# Patient Record
Sex: Female | Born: 2005 | Race: White | Hispanic: Yes | Marital: Single | Smoking: Never smoker
Health system: Southern US, Community
[De-identification: ages and names within clinical notes are randomized; demographics above are authoritative.]

## PROBLEM LIST (undated history)

## (undated) ENCOUNTER — Emergency Department (HOSPITAL_COMMUNITY): Admission: EM | Payer: Medicaid Other | Source: Home / Self Care

## (undated) DIAGNOSIS — F061 Catatonic disorder due to known physiological condition: Secondary | ICD-10-CM

## (undated) DIAGNOSIS — F419 Anxiety disorder, unspecified: Secondary | ICD-10-CM

## (undated) DIAGNOSIS — F209 Schizophrenia, unspecified: Secondary | ICD-10-CM

## (undated) HISTORY — PX: OOPHORECTOMY: SHX86

---
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
## 2023-04-10 ENCOUNTER — Ambulatory Visit (HOSPITAL_COMMUNITY)
Admission: EM | Admit: 2023-04-10 | Discharge: 2023-04-12 | Disposition: A | Discharge status: Home / Self Care | Payer: Medicaid Other | Attending: Psychiatry | Admitting: Psychiatry

## 2023-04-10 ENCOUNTER — Other Ambulatory Visit: Payer: Self-pay

## 2023-04-10 DIAGNOSIS — R4182 Altered mental status, unspecified: Secondary | ICD-10-CM | POA: Insufficient documentation

## 2023-04-10 DIAGNOSIS — Z1152 Encounter for screening for COVID-19: Secondary | ICD-10-CM | POA: Insufficient documentation

## 2023-04-10 DIAGNOSIS — R462 Strange and inexplicable behavior: Secondary | ICD-10-CM | POA: Insufficient documentation

## 2023-04-10 DIAGNOSIS — F061 Catatonic disorder due to known physiological condition: Secondary | ICD-10-CM

## 2023-04-10 LAB — CBC WITH DIFFERENTIAL/PLATELET
Abs Immature Granulocytes: 0.01 10*3/uL (ref 0.00–0.07)
Basophils Absolute: 0 10*3/uL (ref 0.0–0.1)
Basophils Relative: 0 %
Eosinophils Absolute: 0 10*3/uL (ref 0.0–0.5)
Eosinophils Relative: 1 %
HCT: 37.9 % (ref 36.0–46.0)
Hemoglobin: 12.4 g/dL (ref 12.0–15.0)
Immature Granulocytes: 0 %
Lymphocytes Relative: 27 %
Lymphs Abs: 1.8 10*3/uL (ref 0.7–4.0)
MCH: 28.5 pg (ref 26.0–34.0)
MCHC: 32.7 g/dL (ref 30.0–36.0)
MCV: 87.1 fL (ref 80.0–100.0)
Monocytes Absolute: 0.4 10*3/uL (ref 0.1–1.0)
Monocytes Relative: 6 %
Neutro Abs: 4.3 10*3/uL (ref 1.7–7.7)
Neutrophils Relative %: 66 %
Platelets: 272 10*3/uL (ref 150–400)
RBC: 4.35 MIL/uL (ref 3.87–5.11)
RDW: 12.9 % (ref 11.5–15.5)
WBC: 6.5 10*3/uL (ref 4.0–10.5)
nRBC: 0 % (ref 0.0–0.2)

## 2023-04-10 LAB — COMPREHENSIVE METABOLIC PANEL
ALT: 21 U/L (ref 0–44)
AST: 32 U/L (ref 15–41)
Albumin: 4.1 g/dL (ref 3.5–5.0)
Alkaline Phosphatase: 57 U/L (ref 38–126)
Anion gap: 11 (ref 5–15)
BUN: 14 mg/dL (ref 6–20)
CO2: 26 mmol/L (ref 22–32)
Calcium: 9 mg/dL (ref 8.9–10.3)
Chloride: 102 mmol/L (ref 98–111)
Creatinine, Ser: 1.04 mg/dL — ABNORMAL HIGH (ref 0.44–1.00)
GFR, Estimated: >60 mL/min (ref >60)
Glucose, Bld: 102 mg/dL — ABNORMAL HIGH (ref 70–99)
Potassium: 3.6 mmol/L (ref 3.5–5.1)
Sodium: 139 mmol/L (ref 135–145)
Total Bilirubin: 0.8 mg/dL (ref 0.3–1.2)
Total Protein: 6.9 g/dL (ref 6.5–8.1)

## 2023-04-10 LAB — POCT URINE DRUG SCREEN - MANUAL ENTRY (I-SCREEN)
POC Amphetamine UR: NOT DETECTED
POC Buprenorphine (BUP): NOT DETECTED
POC Cocaine UR: NOT DETECTED
POC Marijuana UR: NOT DETECTED
POC Methadone UR: NOT DETECTED
POC Methamphetamine UR: NOT DETECTED
POC Morphine: NOT DETECTED
POC Oxazepam (BZO): NOT DETECTED
POC Oxycodone UR: NOT DETECTED
POC Secobarbital (BAR): NOT DETECTED

## 2023-04-10 LAB — URINALYSIS, ROUTINE W REFLEX MICROSCOPIC
Bilirubin Urine: NEGATIVE
Glucose, UA: NEGATIVE mg/dL
Hgb urine dipstick: NEGATIVE
Ketones, ur: 5 mg/dL — AB
Leukocytes,Ua: NEGATIVE
Nitrite: NEGATIVE
Protein, ur: NEGATIVE mg/dL
Specific Gravity, Urine: 1.017 (ref 1.005–1.030)
pH: 5 (ref 5.0–8.0)

## 2023-04-10 LAB — POC URINE PREG, ED: Preg Test, Ur: NEGATIVE

## 2023-04-10 LAB — SARS CORONAVIRUS 2 BY RT PCR: SARS Coronavirus 2 by RT PCR: NEGATIVE

## 2023-04-10 LAB — TSH: TSH: 1.082 u[IU]/mL (ref 0.350–4.500)

## 2023-04-10 MED ORDER — ACETAMINOPHEN 325 MG PO TABS: 650.0000 mg | ORAL_TABLET | Freq: Four times a day (QID) | ORAL | Status: DC | PRN

## 2023-04-10 MED ORDER — HYDROXYZINE HCL 25 MG PO TABS: 25.0000 mg | ORAL_TABLET | Freq: Four times a day (QID) | ORAL | Status: DC | PRN

## 2023-04-10 MED ORDER — MAGNESIUM HYDROXIDE 400 MG/5ML PO SUSP: 30.0000 mL | Freq: Every day | ORAL | Status: DC | PRN

## 2023-04-10 MED ORDER — OLANZAPINE 5 MG PO TBDP
5.0000 mg | ORAL_TABLET | Freq: Every day | ORAL | Status: DC
  Filled 2023-04-10 (×2): qty 1

## 2023-04-10 MED ORDER — ALUM & MAG HYDROXIDE-SIMETH 200-200-20 MG/5ML PO SUSP: 30.0000 mL | ORAL | Status: DC | PRN

## 2023-04-10 MED ORDER — LORAZEPAM 1 MG PO TABS
2.0000 mg | ORAL_TABLET | Freq: Once | ORAL | Status: AC
  Administered 2023-04-10: 2 mg via ORAL
  Filled 2023-04-10: qty 2

## 2023-04-10 MED ORDER — DIPHENHYDRAMINE HCL 25 MG PO CAPS: 25.0000 mg | ORAL_CAPSULE | Freq: Four times a day (QID) | ORAL | Status: DC | PRN

## 2023-04-10 MED ORDER — LORAZEPAM 1 MG PO TABS: 1.0000 mg | ORAL_TABLET | Freq: Four times a day (QID) | ORAL | Status: DC | PRN

## 2023-04-10 NOTE — ED Notes (Signed)
Patient has been admitted to adult obs bed 1 r/t blizzar behaviors per family. Patient is A&O x 3. Observed stirring into space or at the walls as if responding to an internal stimulus.  Patient answers some question appropriately and declined to answer others. Patient speech is soft and pressured when answering questions. She denies SI, HI but would not answer concerning AVH. Cup of water given as mother reports patient has just gotten over having a kidney stone.

## 2023-04-10 NOTE — BH Assessment (Signed)
Comprehensive Clinical Assessment (CCA) Note  04/10/2023 EBONE DELAGUILA [id number] DISPOSITION: Weber NP recommends patient be observed in continuous assessment.   The patient demonstrates the following risk factors for suicide: Chronic risk factors for suicide include: N/A. Acute risk factors for suicide include: N/A. Protective factors for this patient include: coping skills. Considering these factors, the overall suicide risk at this point appears to be low. Patient is appropriate for outpatient follow up.   Patient is a 17 year old female that presents this date as a voluntary walk in brought in by her parents after she has not been responsive for the last two days. Patient seems to process some of the content of this writer's questions and nods her head, "yes and no," to a few questions. Patient denies any S/I, H/I or AVH.  Patient is observed to be sitting in the triage room and just stares at this writer and NP Weber as we attempted to assess. Patient denies any ingestions or SA history. Patient when asked to sign her voluntary consent only makes one letter. "H." This Clinical research associate and NP Weber spent a considerable amount of time trying to gather her history unsuccessfully.   History is limited per chart review and is obtained from family who is present. Mother states patient resides with her and her brother at their residence and briefly attended [employer] for one semester before withdrawing. Mother reports patient has always been introverted and quite but denies that patient to her knowledge, has ever attempted to self harm or had a SA history. Mother states patient has never been diagnosed with a mental health disorder or has ever been admitted into a mental health facility.   Mother states patient had lost her father two years ago and had brought a picture of him into her room a few days ago once she started withdrawing. Patient currently lives with Mom and brother. Patient's father died  two years ago. She experienced some bullying her senior year of high school. Per Mom, patient has withdrawn more and more over the last few months, she has stopped going to classes at [employer] and for the last two days has been essentially mute and sleeping a lot. She has also done some bizarre things like moved her father's picture to the middle of the kitchen and spent hours in what appears to be prayer.   She is alert, oriented X 2 flat, and mostly mute. Her mood is depressed with congruent affect.  She has very limited and blocked speech; behavior is bizarre. Objectively there is evidence of psychosis or delusional thinking; patient stares off into corners or at walls.  Patient is not able to converse coherently. She is very pre-occupied.  She denies suicidal and homicidal ideation. She is unable to respond when asked about psychosis or paranoia.  Patient was unable or unwilling to answer most questions appropriately.       Chief Complaint: No chief complaint on file.  Visit Diagnosis: Altered Mental State     CCA Screening, Triage and Referral (STR)  Patient Reported Information How did you hear about [place]? Self  What Is the Reason for Your Visit/Call Today? Patient is a 17 year old female that presents this date voluntary brought in by family members with an altered mental state. Patient denies any S/I, H/I or AVH although renders limited history. Patient appears not to be able to process all the content of this writer's questions and just will nod her head "yes and no," to questions she  seems to understand. History is limited per chart review. Patient denies any SA history  How Long Has This Been Causing You Problems? <Week  What Do You Feel Would Help You the Most Today? Treatment for Depression or other mood problem   Have You Recently Had Any Thoughts About Hurting Yourself? No  Are You Planning to Commit Suicide/Harm Yourself At This time? No  Flowsheet Row ED from 04/10/2023 in  [facility]  C-SSRS RISK CATEGORY No Risk          Have you Recently Had Thoughts About Hurting Someone Karolee Ohs? No  Are You Planning to Harm Someone at This Time? No  Explanation: NA   Have You Used Any Alcohol or Drugs in the Past 24 Hours? No  What Did You Use and How Much? NA   Do You Currently Have a Therapist/Psychiatrist? No  Name of Therapist/Psychiatrist: Name of Therapist/Psychiatrist: NA   Have You Been Recently Discharged From Any Office Practice or Programs? No  Explanation of Discharge From Practice/Program: NA     CCA Screening Triage Referral Assessment Type of Contact: Face-to-Face  Telemedicine Service Delivery:   Is this Initial or Reassessment?   Date Telepsych consult ordered in [facility]:    Time Telepsych consult ordered in [facility]:    Location of Assessment: [facility] Assessment Services  Provider Location: [facility] Assessment Services   Collateral Involvement: Parents who are present   Does Patient Have a Automotive engineer Guardian? No  Legal Guardian Contact Information: NA  Copy of Legal Guardianship Form: -- (NA)  Legal Guardian Notified of Arrival: -- (NA)  Legal Guardian Notified of Pending Discharge: -- (NA)  If Minor and Not Living with Parent(s), Who has Custody? NA  Is CPS involved or ever been involved? Never  Is APS involved or ever been involved? Never   Patient Determined To Be At Risk for Harm To Self or Others Based on Review of Patient Reported Information or Presenting Complaint? No  Method: No Plan  Availability of Means: No access or NA  Intent: Vague intent or NA  Notification Required: No need or identified person  Additional Information for Danger to Others Potential: -- (NA)  Additional Comments for Danger to Others Potential: NA  Are There Guns or Other Weapons in Your Home? No  Types of Guns/Weapons: NA  Are These Weapons Safely Secured?                            --  (NA)  Who Could Verify You Are Able To Have These Secured: NA  Do You Have any Outstanding Charges, Pending Court Dates, Parole/Probation? Denies  Contacted To Inform of Risk of Harm To Self or Others: Other: Comment (NA)    Does Patient Present under Involuntary Commitment? No    [place] of Residence: [place]   Patient Currently Receiving the Following Services: Not Receiving Services   Determination of Need: Urgent (48 hours)   Options For Referral: Other: Comment (Continuious assessment)     CCA Biopsychosocial Patient Reported Schizophrenia/Schizoaffective Diagnosis in Past: No   Strengths: UTA due to patient's AMS   Mental Health Symptoms Depression:   -- (UTA)   Duration of Depressive symptoms:    Mania:   None   Anxiety:    -- (UTA)   Psychosis:   -- (UTA)   Duration of Psychotic symptoms:    Trauma:   -- (UTA)   Obsessions:  None   Compulsions:   None   Inattention:   None   Hyperactivity/Impulsivity:   None   Oppositional/Defiant Behaviors:   None   Emotional Irregularity:   -- (UTA)   Other Mood/Personality Symptoms:   UTA    Mental Status Exam Appearance and self-care  Stature:   Small   Weight:   Thin   Clothing:   Casual   Grooming:   Normal   Cosmetic use:   None   Posture/gait:   Normal   Motor activity:   Not Remarkable   Sensorium  Attention:   Distractible   Concentration:   Preoccupied; Scattered   Orientation:   -- (UTA)   Recall/memory:   -- (UTA)   Affect and Mood  Affect:   Flat; Depressed   Mood:   Depressed   Relating  Eye contact:   Fleeting   Facial expression:   Depressed   Attitude toward examiner:   Guarded   Thought and Language  Speech flow:  Paucity; Slow   Thought content:   Suspicious   Preoccupation:   None   Hallucinations:   None   Organization:   Disorganized   Company secretary of Knowledge:   Poor   Intelligence:    Average   Abstraction:   Normal   Judgement:   Impaired   Reality Testing:   Distorted   Insight:   Poor   Decision Making:   Vacilates   Social Functioning  Social Maturity:   Responsible   Social Judgement:   Normal   Stress  Stressors:   -- Industrial/product designer)   Coping Ability:   -- Industrial/product designer)   Skill Deficits:   -- (UTA)   Supports:   Family     Religion: Religion/Spirituality Are You A Religious Person?:  (UTA) How Might This Affect Treatment?: UTA  Leisure/Recreation: Leisure / Recreation Do You Have Hobbies?:  (UTA)  Exercise/Diet: Exercise/Diet Do You Exercise?:  (UTA) Have You Gained or Lost A Significant Amount of Weight in the Past Six Months?:  (UTA) Do You Follow a Special Diet?:  (UTA) Do You Have Any Trouble Sleeping?:  (UTA)   CCA Employment/Education Employment/Work Situation: Employment / Work Situation Employment Situation: Unemployed Patient's Job has Been Impacted by Current Illness: No Has Patient ever Been in Equities trader?: No  Education: Education Is Patient Currently Attending School?: No Last Grade Completed: 12 Did You Product manager?: Yes What Type of College Degree Do you Have?: Parents report patient was attending [employer] but has withdrawn Did You Have An Individualized Education Program (IIEP): No Did You Have Any Difficulty At School?: No Patient's Education Has Been Impacted by Current Illness: No   CCA Family/Childhood History Family and Relationship History: Family history Marital status: Single Does patient have children?: No  Childhood History:  Childhood History By whom was/is the patient raised?: Both parents Did patient suffer any verbal/emotional/physical/sexual abuse as a child?: No Did patient suffer from severe childhood neglect?: No Has patient ever been sexually abused/assaulted/raped as an adolescent or adult?: No Was the patient ever a victim of a crime or a disaster?: No Witnessed domestic violence?:  No Has patient been affected by domestic violence as an adult?: No       CCA Substance Use Alcohol/Drug Use: Alcohol / Drug Use Pain Medications: See MAR Prescriptions: See MAR Over the Counter: See MAR History of alcohol / drug use?: No history of alcohol / drug abuse Longest period of sobriety (when/how long): NA  Negative Consequences of Use:  (NA) Withdrawal Symptoms:  (NA)                         ASAM's:  Six Dimensions of Multidimensional Assessment  Dimension 1:  Acute Intoxication and/or Withdrawal Potential:   Dimension 1:  Description of individual's past and current experiences of substance use and withdrawal: NA  Dimension 2:  Biomedical Conditions and Complications:   Dimension 2:  Description of patient's biomedical conditions and  complications: NA  Dimension 3:  Emotional, Behavioral, or Cognitive Conditions and Complications:  Dimension 3:  Description of emotional, behavioral, or cognitive conditions and complications: NA  Dimension 4:  Readiness to Change:  Dimension 4:  Description of Readiness to Change criteria: NA  Dimension 5:  Relapse, Continued use, or Continued Problem Potential:  Dimension 5:  Relapse, continued use, or continued problem potential critiera description: NA  Dimension 6:  Recovery/Living Environment:  Dimension 6:  Recovery/Iiving environment criteria description: NA  ASAM Severity Score:    ASAM Recommended Level of Treatment: ASAM Recommended Level of Treatment:  (NA)   Substance use Disorder (SUD) Substance Use Disorder (SUD)  Checklist Symptoms of Substance Use:  (NA)  Recommendations for Services/Supports/Treatments: Recommendations for Services/Supports/Treatments Recommendations For Services/Supports/Treatments:  (NA)  Discharge Disposition:    DSM5 Diagnoses: There are no problems to display for this patient.    Referrals to Alternative Service(s): Referred to Alternative Service(s):   Place:   Date:   Time:     Referred to Alternative Service(s):   Place:   Date:   Time:    Referred to Alternative Service(s):   Place:   Date:   Time:    Referred to Alternative Service(s):   Place:   Date:   Time:     Alfredia Ferguson, LCAS

## 2023-04-10 NOTE — ED Notes (Addendum)
Dash made aware of stat lab p/u from facility to go to [facility] lab stat, Order # [id number].

## 2023-04-10 NOTE — ED Notes (Signed)
Pt A&O x 4, sitting up at bedside, no distress noted.  Gait slightly unsteady.  Monitoring for safety.

## 2023-04-10 NOTE — ED Provider Notes (Signed)
Behavioral Health Urgent Care Medical Screening Exam  Patient Name: Terri Rich MRN: [id number] Date of Evaluation: 04/10/23 Chief Complaint:  Mom says "my daughter is not ok" Diagnosis:  Final diagnoses:  Altered mental status, unspecified altered mental status type    History of Present illness: Terri Rich is a 17 y.o. female brought to [facility] by her family after bizarre behavior and not speaking for two days.    Terri Rich, 17 y.o., female patient seen face to face by this provider, consulted with Dr. Viviano Simas; and chart reviewed on 04/10/23.  On evaluation Terri Rich is mostly nonverbal.  She provides a few yes or no answers to questions and appears to be struggling to speak.  Patient arrives in a disheveled state.  She spends much of the interview staring at the wall and only occasionally making eye contact. When signing the voluntary form, patient was only able to write an H.  Patient appears to be responding to internal stimuli throughout interview.    During evaluation Terri Rich is seated in a chair in no acute distress. She is alert, oriented X 2 flat, and mostly mute.  Her mood is depressed with congruent affect.  She has very limited and blocked speech; behavior is bizarre.  Objectively there is evidence of psychosis or delusional thinking; patient stares off into corners or at walls.  Patient is not able to converse coherently. She is very pre-occupied.  She denies suicidal and homicidal ideation. She is unable to respond when asked about psychosis or paranoia.  Patient was unable or unwilling to answer most questions appropriately.    Collateral from Mom:  Patient currently lives with Mom and brother.  She is a Consulting civil engineer at [employer]. 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 She experienced some bullying her senior year of high school and missed enough class graduation was at risk. Per Mom, patient has withdrawn more  and more over the last few months, she has stopped going to classes at [employer] and for the last two days has been essentially mute and sleeping a lot.  She has also done some bizarre things like moved her father's picture to the middle of the kitchen and spent hours in what appears to be prayer. Patient has not spoken for two days and she is sleeping 9 hours or more at night.  Mom has never seen her like this before.  Initially Mom said there is no family history of mental illness.  She then corrected herself and said the patient's aunt had a similar episode, took some medicine and was fine.              Psychiatric Specialty Exam  Presentation  General Appearance:Disheveled  Eye Contact:Fleeting  Speech:Blocked  Speech Volume:Decreased  Handedness:Right   Mood and Affect  Mood: Depressed  Affect: Congruent   Thought Process  Thought Processes: Other (comment) (Unable to assess)  Descriptions of Associations:-- (Unable to assess)  Orientation:Other (comment) (Unable to assess)  Thought Content:Other (comment) (Unable to assess)    Hallucinations:Other (comment) (Unable to assess)  Ideas of Reference:Other (comment) (Unable to assess)  Suicidal Thoughts:No  Homicidal Thoughts:No   Sensorium  Memory: Other (comment) (Unable to assess)  Judgment: Other (comment) (Unable to assess)  Insight: Other (comment) (Unable to assess)   Executive Functions  Concentration: Other (comment) (Unable to assess)  Attention Span: Other (comment) (Unable to assess)  Recall: Other (comment) (Unable to assess)  Fund of Knowledge: Other (comment) (Unable to assess)  Language: Other (comment) (Unable to assess)   Psychomotor Activity  Psychomotor Activity: Decreased   Assets  Assets: Housing; Social Support; Physical Health   Sleep  Sleep: Good  Number of hours:  10   Physical Exam: Physical Exam Vitals and nursing note reviewed. Exam conducted with a  chaperone present.  Eyes:     Pupils: Pupils are equal, round, and reactive to light.  Pulmonary:     Effort: Pulmonary effort is normal.  Skin:    General: Skin is dry.  Neurological:     Mental Status: She is alert. She is disoriented.  Psychiatric:        Speech: She is noncommunicative.        Behavior: Behavior is withdrawn.    Review of Systems  Psychiatric/Behavioral:         Altered mental status  All other systems reviewed and are negative.  Blood pressure 114/77, pulse 77, temperature 97.8 F (36.6 C), temperature source Oral, resp. rate 16, SpO2 100%. There is no height or weight on file to calculate BMI.  Musculoskeletal: Strength & Muscle Tone: within normal limits Gait & Station: normal Patient leans: N/A   [facility] MSE Discharge Disposition for Follow up and Recommendations: Observe overnight and reassess in the AM   Thomes Lolling, NP 04/10/2023, 12:25 PM

## 2023-04-10 NOTE — ED Notes (Signed)
Patient A&O x 3. More verbally communicative with improved eye contact. Consumed a meal and juice for dinner. No further indication of responsiveness to internal stimuli. Denies SI, HI.

## 2023-04-11 NOTE — ED Notes (Signed)
Pt sleeping at present, no distress noted.  Monitoring for safety. 

## 2023-04-11 NOTE — ED Notes (Addendum)
Patient lying in bed, in no acute distress, this nurse went to speak with patient, patient is slow to respond.

## 2023-04-11 NOTE — ED Notes (Addendum)
Patient A&O x 2-3 with a blank and distant facial expression. She appears to be responding to internal stimuli by intermittently darting eyes upward and stirring for a few seconds.Patient has increase in verbal communication that remains pressured and soft. She is able to answer simple questions with few word phrases. Patient denies SI, HI and visual hallucinations. Patient is currently eating cereal for breakfast. Patient agreed verbally to a safety contract.

## 2023-04-11 NOTE — ED Notes (Signed)
This patients mother called and inquired on the patients condition. This nurse advised the mother that I am not able to give her any information on her daughter with out the patients consent. The mother became argumentative, and said that her daughter called her and said she wants to be picked up. This nurse advised the mother that if the patient gives this nurse consent I am happy to speak with her about her daughter. The mother said she spoke with another nurse a day ago who provided information to her. This nurse advised the mother that due to HIPAA guidelines, I will have to obtain consent from her daughter to provide information to her. This nurse advised the patients mother that at this time I have to provide patient care and can not continue to stay on the phone and go back and forth with her, however I will go and speak with her daughter and if the gives consent I will call her back, if not I will instruct the daughter to call her.

## 2023-04-11 NOTE — ED Notes (Signed)
Pt sitting up eating at present, no distress noted.  Monitoring for safety.

## 2023-04-11 NOTE — ED Provider Notes (Signed)
Behavioral Health Progress Note  Date and Time: 04/11/2023 1:50 PM Name: Terri Rich MRN:  [id number]  Subjective:  Terri Rich, 17 y.o., female patient seen face to face by this provider, consulted with Dr. Viviano Simas; and chart reviewed on 04/11/23.  On evaluation Terri Rich reports she slept well.  Patient is slightly more interactive than her initial presentation yesterday.  Psychomotor activity remains slow.  Patient continues to stare at the wall intermittently appearing to respond to internal stimuli at times.  Patient answers more questions today than yesterday although thought blocking remains present.    Patient was provided 2mg  of ativan yesterday afternoon as an ativan challenge. Symptoms did not improve significantly with the ativan.  Patient received 5mg  of zyprexa 10/19 at [facility] and symptoms have not significantly improved.  Plan to continue the zyprexa and seek inpatient psychiatric admission.   During evaluation Terri Rich is laying in bed in no acute distress. She is alert, oriented x 2 calm, and cooperative.  Her mood is depressed  with congruent affect.  She has slow speech with thought blocking, and slow psychomotor.  Objectively there is evidence of psychosis/mania or delusional thinking.  Patient is not able to converse coherently, there are no clear goal directed thoughts and she seems pre-occupied often.  She also denies suicidal/self-harm/homicidal ideation, psychosis, and paranoia.  Patient answered more questions today than yesterday.   Patient does not appear to be able to care for herself in her current state. She appears to be responding to internal stimuli and requires inpatient psychiatric hospitalization for stabilization and management.   Diagnosis:  Final diagnoses:  Altered mental status, unspecified altered mental status type    Total Time spent with patient: 30 minutes  Past Psychiatric History: None  noted Past Medical History: Kidney stones Family History: None noted Family Psychiatric  History: Aunt - unknown mental health "episode" Social History: Patient lives with her mother and brother; father died in Apr 27, 2021  Additional Social History:    Pain Medications: See MAR Prescriptions: See MAR Over the Counter: See MAR History of alcohol / drug use?: No history of alcohol / drug abuse Longest period of sobriety (when/how long): NA Negative Consequences of Use:  (NA) Withdrawal Symptoms:  (NA)                    Sleep: Good  Appetite:  Good  Current Medications:  Current Facility-Administered Medications  Medication Dose Route Frequency Provider Last Rate Last Admin   acetaminophen (TYLENOL) tablet 650 mg  650 mg Oral Q6H PRN Marcellas Marchant A, NP       alum & mag hydroxide-simeth (MAALOX/MYLANTA) 200-200-20 MG/5ML suspension 30 mL  30 mL Oral Q4H PRN Kawena Lyday A, NP       diphenhydrAMINE (BENADRYL) capsule 25 mg  25 mg Oral Q6H PRN Catarina Huntley A, NP       hydrOXYzine (ATARAX) tablet 25 mg  25 mg Oral Q6H PRN Shantoya Geurts A, NP       LORazepam (ATIVAN) tablet 1 mg  1 mg Oral Q6H PRN Shalev Helminiak A, NP       magnesium hydroxide (MILK OF MAGNESIA) suspension 30 mL  30 mL Oral Daily PRN Arliss Hepburn A, NP       OLANZapine zydis (ZYPREXA) disintegrating tablet 5 mg  5 mg Oral QHS Jabar Krysiak A, NP       Current Outpatient Medications  Medication Sig Dispense Refill   magnesium oxide (MAG-OX)  400 (240 Mg) MG tablet Take 400 mg by mouth daily.     cetirizine (ZYRTEC) 10 MG tablet Take 1 tablet (10 mg total) by mouth at bedtime. 30 tablet 0   dicyclomine (BENTYL) 10 MG capsule Take 1 capsule (10 mg total) by mouth 3 (three) times daily as needed for spasms. 10 capsule 0   ibuprofen (ADVIL,MOTRIN) 100 MG/5ML suspension Take 13.6 mLs (272 mg total) by mouth every 6 (six) hours as needed for mild pain or moderate pain. 237 mL 0   ondansetron (ZOFRAN-ODT) 4 MG disintegrating tablet Take  1 tablet (4 mg total) by mouth every 8 (eight) hours as needed for nausea or vomiting. 10 tablet 0   pseudoephedrine (SUDAFED) 30 MG tablet Take 1 tablet (30 mg total) by mouth every 6 (six) hours as needed for congestion. 30 tablet 0    Labs  Lab Results:  Admission on 04/10/2023  Component Date Value Ref Range Status   SARS Coronavirus 2 by RT PCR 04/10/2023 NEGATIVE  NEGATIVE Final   Performed at [facility], [address], [place], [place] [address]   WBC 04/10/2023 6.5  4.0 - 10.5 K/uL Final   RBC 04/10/2023 4.35  3.87 - 5.11 MIL/uL Final   Hemoglobin 04/10/2023 12.4  12.0 - 15.0 g/dL Final   HCT 60/45/4098 37.9  36.0 - 46.0 % Final   MCV 04/10/2023 87.1  80.0 - 100.0 fL Final   MCH 04/10/2023 28.5  26.0 - 34.0 pg Final   MCHC 04/10/2023 32.7  30.0 - 36.0 g/dL Final   RDW 11/91/4782 12.9  11.5 - 15.5 % Final   Platelets 04/10/2023 272  150 - 400 K/uL Final   nRBC 04/10/2023 0.0  0.0 - 0.2 % Final   Neutrophils Relative % 04/10/2023 66  % Final   Neutro Abs 04/10/2023 4.3  1.7 - 7.7 K/uL Final   Lymphocytes Relative 04/10/2023 27  % Final   Lymphs Abs 04/10/2023 1.8  0.7 - 4.0 K/uL Final   Monocytes Relative 04/10/2023 6  % Final   Monocytes Absolute 04/10/2023 0.4  0.1 - 1.0 K/uL Final   Eosinophils Relative 04/10/2023 1  % Final   Eosinophils Absolute 04/10/2023 0.0  0.0 - 0.5 K/uL Final   Basophils Relative 04/10/2023 0  % Final   Basophils Absolute 04/10/2023 0.0  0.0 - 0.1 K/uL Final   Immature Granulocytes 04/10/2023 0  % Final   Abs Immature Granulocytes 04/10/2023 0.01  0.00 - 0.07 K/uL Final   Performed at [facility], [address] [place], [place] [address]   Sodium 04/10/2023 139  135 - 145 mmol/L Final   Potassium 04/10/2023 3.6  3.5 - 5.1 mmol/L Final   Chloride 04/10/2023 102  98 - 111 mmol/L Final   CO2 04/10/2023 26  22 - 32 mmol/L Final   Glucose, Bld 04/10/2023 102 (H)  70 - 99 mg/dL Final   Glucose reference range applies only to samples  taken after fasting for at least 8 hours.   BUN 04/10/2023 14  6 - 20 mg/dL Final   Creatinine, Ser 04/10/2023 1.04 (H)  0.44 - 1.00 mg/dL Final   Calcium 30/86/5784 9.0  8.9 - 10.3 mg/dL Final   Total Protein 69/62/9528 6.9  6.5 - 8.1 g/dL Final   Albumin 41/32/4401 4.1  3.5 - 5.0 g/dL Final   AST 02/72/5366 32  15 - 41 U/L Final   ALT 04/10/2023 21  0 - 44 U/L Final  Alkaline Phosphatase 04/10/2023 57  38 - 126 U/L Final   Total Bilirubin 04/10/2023 0.8  0.3 - 1.2 mg/dL Final   GFR, Estimated 04/10/2023 >60  >60 mL/min Final   Comment: (NOTE) Calculated using the CKD-EPI Creatinine Equation (2021)    Anion gap 04/10/2023 11  5 - 15 Final   Performed at [facility], [address] [place], [place] [address]   TSH 04/10/2023 1.082  0.350 - 4.500 uIU/mL Final   Comment: Performed by a 3rd Generation assay with a functional sensitivity of <=0.01 uIU/mL. Performed at [facility], [address] [place], [place] [address]    Color, Urine 04/10/2023 YELLOW  YELLOW Final   APPearance 04/10/2023 CLEAR  CLEAR Final   Specific Gravity, Urine 04/10/2023 1.017  1.005 - 1.030 Final   pH 04/10/2023 5.0  5.0 - 8.0 Final   Glucose, UA 04/10/2023 NEGATIVE  NEGATIVE mg/dL Final   Hgb urine dipstick 04/10/2023 NEGATIVE  NEGATIVE Final   Bilirubin Urine 04/10/2023 NEGATIVE  NEGATIVE Final   Ketones, ur 04/10/2023 5 (A)  NEGATIVE mg/dL Final   Protein, ur 29/52/8413 NEGATIVE  NEGATIVE mg/dL Final   Nitrite 24/40/1027 NEGATIVE  NEGATIVE Final   Leukocytes,Ua 04/10/2023 NEGATIVE  NEGATIVE Final   Performed at [facility], [address] [place], [place] [address]   Preg Test, Ur 04/10/2023 Negative  Negative Final   POC Amphetamine UR 04/10/2023 None Detected  NONE DETECTED (Cut Off Level 1000 ng/mL) Final   POC Secobarbital (BAR) 04/10/2023 None Detected  NONE DETECTED (Cut Off Level 300 ng/mL) Final   POC Buprenorphine (BUP) 04/10/2023 None Detected  NONE DETECTED (Cut Off  Level 10 ng/mL) Final   POC Oxazepam (BZO) 04/10/2023 None Detected  NONE DETECTED (Cut Off Level 300 ng/mL) Final   POC Cocaine UR 04/10/2023 None Detected  NONE DETECTED (Cut Off Level 300 ng/mL) Final   POC Methamphetamine UR 04/10/2023 None Detected  NONE DETECTED (Cut Off Level 1000 ng/mL) Final   POC Morphine 04/10/2023 None Detected  NONE DETECTED (Cut Off Level 300 ng/mL) Final   POC Methadone UR 04/10/2023 None Detected  NONE DETECTED (Cut Off Level 300 ng/mL) Final   POC Oxycodone UR 04/10/2023 None Detected  NONE DETECTED (Cut Off Level 100 ng/mL) Final   POC Marijuana UR 04/10/2023 None Detected  NONE DETECTED (Cut Off Level 50 ng/mL) Final    Blood Alcohol level:  No results found for: "ETH"  Metabolic Disorder Labs: No results found for: "HGBA1C", "MPG" No results found for: "PROLACTIN" No results found for: "CHOL", "TRIG", "HDL", "CHOLHDL", "VLDL", "LDLCALC"  Therapeutic Lab Levels: No results found for: "LITHIUM" No results found for: "VALPROATE" No results found for: "CBMZ"  Physical Findings   Flowsheet Row ED from 04/10/2023 in [facility]  C-SSRS RISK CATEGORY No Risk        Musculoskeletal  Strength & Muscle Tone: within normal limits Gait & Station: normal Patient leans: N/A  Psychiatric Specialty Exam  Presentation  General Appearance:  Disheveled  Eye Contact: Fair  Speech: Blocked  Speech Volume: Normal  Handedness: Right   Mood and Affect  Mood: Depressed  Affect: Congruent   Thought Process  Thought Processes: Disorganized  Descriptions of Associations:Loose  Orientation:Partial (Oriented to person and place)  Thought Content:Other (comment) (Unable to assess)  Diagnosis of Schizophrenia or Schizoaffective disorder in past: No    Hallucinations:Hallucinations: Other (comment) (Appears to be responding to internal stimuli)  Ideas of  Reference:None  Suicidal Thoughts:Suicidal Thoughts:  No  Homicidal Thoughts:Homicidal Thoughts: No   Sensorium  Memory: Other (comment) (Unable to assess)  Judgment: Impaired  Insight: Lacking   Executive Functions  Concentration: Fair  Attention Span: Fair  Recall: Other (comment) (Unable to assess)  Fund of Knowledge: Fair  Language: Fair   Lexicographer Activity: Psychomotor Activity: Decreased   Assets  Assets: Housing; Social Support; Physical Health   Sleep  Sleep: Sleep: Good Number of Hours of Sleep: 9   Nutritional Assessment (For OBS and FBC admissions only) Has the patient had a weight loss or gain of 10 pounds or more in the last 3 months?: -- (Unable to assess) Has the patient had a decrease in food intake/or appetite?: Yes Does the patient have dental problems?: No Does the patient have eating habits or behaviors that may be indicators of an eating disorder including binging or inducing vomiting?: No Has the patient recently lost weight without trying?: 2.0 Has the patient been eating poorly because of a decreased appetite?: 0 Malnutrition Screening Tool Score: 2    Physical Exam  Physical Exam Vitals and nursing note reviewed.  Eyes:     Pupils: Pupils are equal, round, and reactive to light.  Pulmonary:     Effort: Pulmonary effort is normal.  Skin:    General: Skin is dry.  Neurological:     Mental Status: She is alert. She is disoriented.    Review of Systems  Psychiatric/Behavioral:  Positive for hallucinations.   All other systems reviewed and are negative.  Blood pressure 108/63, pulse 92, temperature 98.9 F (37.2 C), temperature source Oral, resp. rate 16, SpO2 100%. There is no height or weight on file to calculate BMI.  Treatment Plan Summary: Patient does not appear to be able to care for herself in her current state. She appears to be responding to internal stimuli and requires inpatient psychiatric hospitalization for stabilization and  management.   Thomes Lolling, NP 04/11/2023 1:50 PM

## 2023-04-11 NOTE — ED Notes (Signed)
Patient currently resting in bed. Patient continues to appear to be responding to internal stimuli at intervals. Blank affect remains.Speech remains pressured with observation of needing to find words to complete her sentences. Patient does deny SI, HI, and visual hallucinations. Patent was able to verbally agree to a safety contract.

## 2023-04-11 NOTE — ED Notes (Addendum)
After speaking with patient, the patient did not provide consent for this nurse to speak with the patients mother, she said she would call her mother. This nurse provided the patient with the mothers call back number. This nurse called the patients mother back at [phone] to advise that her daughter would prefer to call her and did not give me consent to disclose information, however the patients mother did not answer the phone.

## 2023-04-12 ENCOUNTER — Inpatient Hospital Stay (HOSPITAL_COMMUNITY): Admission: AD | Admit: 2023-04-12 | Payer: Medicaid Other | Source: Intra-hospital

## 2023-04-12 MED ORDER — OLANZAPINE 5 MG PO TBDP
5.0000 mg | ORAL_TABLET | Freq: Two times a day (BID) | ORAL | Status: DC
  Filled 2023-04-12: qty 1

## 2023-04-12 NOTE — ED Notes (Signed)
Patient alert and oriented x 3. Denies SI/HI/AVH. Denies intent or plan to harm self or others. Routine conducted according to faculty protocol. Encourage patient to notify staff with any needs or concerns. Patient verbalized agreement and understanding. Will continue to monitor for safety. 

## 2023-04-12 NOTE — ED Notes (Signed)
Patient  sleeping in no acute stress. RR even and unlabored .Environment secured .Will continue to monitor for safely. 

## 2023-04-12 NOTE — ED Notes (Signed)
Patient in milieu. Environment is secured. Will continue to monitor for safety. 

## 2023-04-12 NOTE — ED Notes (Signed)
Pt is currently sleeping, no distress noted, environmental check complete, will continue to monitor patient for safety.  

## 2023-04-12 NOTE — Progress Notes (Signed)
Pt has been accepted to [facility] TODAY 04/12/2023, pending voluntary consent. Bed assignment: 500-1  Pt meets inpatient criteria per Joaquin Courts, NP  Attending Physician will be Phineas Inches, MD  Report can be called to: - Adult unit: [id number]  Pt can arrive after pending items are received  Care Team Notified: [facility] Rona Ravens, RN, Lorella Nimrod, RN, Durwin Reges, RN, Neale Burly, RN, Vernard Gambles, NP, and Joaquin Courts, NP  Cathie Beams, MSW, LCSW  04/12/2023 12:36 PM

## 2023-04-12 NOTE — ED Notes (Signed)
Patient is refusing zyprexa rn will notify provider

## 2023-04-12 NOTE — Care Management (Signed)
OBS Care Management   Writer spoke to the patient mother on the phone..  Her mother reports that she will be coming to pick up the patient at 3:15pm.  Writer informed the NP and the RN working with the patient.

## 2023-04-12 NOTE — ED Provider Notes (Signed)
FBC/OBS ASAP Discharge Summary  Date and Time: 04/12/2023 2:41 PM  Name: Terri Rich  MRN:  [id number]   Discharge Diagnoses:  Final diagnoses:  Brief psychotic disorder with catatonia [facility])  History of Present illness: Terri Rich is a 17 y.o. female brought to [facility] on 04/10/2023 by her family after bizarre behavior and not speaking for two days.   She was initially recommended for inpatient psychiatric admission and admitted to the continuous assessment unit.  Terri Rich, 17 y.o., female patient seen face to face by this provider and Dr. Lucianne Muss, chart reviewed on 04/12/23.  Patient is a Consulting civil engineer at [employer].  She lives in the home with her mother and brother.  Subjective  Of note: Notified by nursing that patient had come to window multiple times this morning and requested to speak to provider about her discharge.  During evaluation Terri Rich is observed sitting in her bed awake.  She is alert/oriented x 4, cooperative, and attentive.  She maintains good eye contact.  She chooses not to answer questions at specific times. When discussing with patient that she had previously been recommended for inpatient admission and she had been accepted at [facility] she then began to speak and participate in assessment.  Her speech is clear coherent and had a decreased tone.  States she does not want to go to the hospital and that she wants to be discharged home.Reports the previous provider told her that she would be discharged this morning.  She will continue to live with her mother.  She continues to endorse depression related to the loss of her father.  She does have a depressed affect.  She is denying SI/HI.  She denies access to firearms/weapons.  She verbally contracts for safety.  She denies auditory/visual hallucinations.  She does not appear manic, psychotic, or paranoid.  She is not catatonic.  She does not appear to be responding to  internal/external stimuli.  Patient initially accepted to [facility] for inpatient psychiatric admission however patient refused to sign consent.  She request to be discharged home.  She gave her permission to contact her mother. Patient was then assessed by Dr. Lucianne Muss and determined that Patient did not meet criteria for involuntary commitment.  Patient's presentation appears to be intentional and behavior based.  Patient does not appear to be experiencing any type of psychosis.  Patient will be discharged.   Call contact by Otis Dials care manager to patient's mother informing of discharge.  Mother agreed to pick patient up at 3:15 PM.  Patient was prescribed Zyprexa while at facility but did not take medication  Stay Summary:   Patient remained calm and cooperative on the unit.  She required no as needed medications for agitation.  Patient will be given outpatient psychiatric resources for therapy and medication management.  Upon discharge patient continued to deny SI/HI/AVH.  Total Time spent with patient: 45 minutes  Past Psychiatric History: see h&p Past Medical History: see h&p Family History: see h&p Family Psychiatric History: see h&p Social History: see h&p Tobacco Cessation:  N/A, patient does not currently use tobacco products  Current Medications:  Current Facility-Administered Medications  Medication Dose Route Frequency Provider Last Rate Last Admin   acetaminophen (TYLENOL) tablet 650 mg  650 mg Oral Q6H PRN Weber, Kyra A, NP       alum & mag hydroxide-simeth (MAALOX/MYLANTA) 200-200-20 MG/5ML suspension 30 mL  30 mL Oral Q4H PRN Weber, Leavy Cella, NP  diphenhydrAMINE (BENADRYL) capsule 25 mg  25 mg Oral Q6H PRN Weber, Kyra A, NP       hydrOXYzine (ATARAX) tablet 25 mg  25 mg Oral Q6H PRN Weber, Kyra A, NP       LORazepam (ATIVAN) tablet 1 mg  1 mg Oral Q6H PRN Weber, Kyra A, NP       magnesium hydroxide (MILK OF MAGNESIA) suspension 30 mL  30 mL Oral Daily PRN Weber,  Kyra A, NP       OLANZapine zydis (ZYPREXA) disintegrating tablet 5 mg  5 mg Oral BID Bing Neighbors, NP       Current Outpatient Medications  Medication Sig Dispense Refill   magnesium oxide (MAG-OX) 400 (240 Mg) MG tablet Take 400 mg by mouth daily.     cetirizine (ZYRTEC) 10 MG tablet Take 1 tablet (10 mg total) by mouth at bedtime. 30 tablet 0   dicyclomine (BENTYL) 10 MG capsule Take 1 capsule (10 mg total) by mouth 3 (three) times daily as needed for spasms. 10 capsule 0   ibuprofen (ADVIL,MOTRIN) 100 MG/5ML suspension Take 13.6 mLs (272 mg total) by mouth every 6 (six) hours as needed for mild pain or moderate pain. 237 mL 0   ondansetron (ZOFRAN-ODT) 4 MG disintegrating tablet Take 1 tablet (4 mg total) by mouth every 8 (eight) hours as needed for nausea or vomiting. 10 tablet 0   pseudoephedrine (SUDAFED) 30 MG tablet Take 1 tablet (30 mg total) by mouth every 6 (six) hours as needed for congestion. 30 tablet 0    PTA Medications:  Facility Ordered Medications  Medication   acetaminophen (TYLENOL) tablet 650 mg   alum & mag hydroxide-simeth (MAALOX/MYLANTA) 200-200-20 MG/5ML suspension 30 mL   magnesium hydroxide (MILK OF MAGNESIA) suspension 30 mL   LORazepam (ATIVAN) tablet 1 mg   diphenhydrAMINE (BENADRYL) capsule 25 mg   hydrOXYzine (ATARAX) tablet 25 mg   [COMPLETED] LORazepam (ATIVAN) tablet 2 mg   OLANZapine zydis (ZYPREXA) disintegrating tablet 5 mg   PTA Medications  Medication Sig   ibuprofen (ADVIL,MOTRIN) 100 MG/5ML suspension Take 13.6 mLs (272 mg total) by mouth every 6 (six) hours as needed for mild pain or moderate pain.   ondansetron (ZOFRAN-ODT) 4 MG disintegrating tablet Take 1 tablet (4 mg total) by mouth every 8 (eight) hours as needed for nausea or vomiting.   pseudoephedrine (SUDAFED) 30 MG tablet Take 1 tablet (30 mg total) by mouth every 6 (six) hours as needed for congestion.   cetirizine (ZYRTEC) 10 MG tablet Take 1 tablet (10 mg total) by mouth  at bedtime.   dicyclomine (BENTYL) 10 MG capsule Take 1 capsule (10 mg total) by mouth 3 (three) times daily as needed for spasms.        No data to display          Flowsheet Row ED from 04/10/2023 in [facility]  C-SSRS RISK CATEGORY No Risk       Musculoskeletal  Strength & Muscle Tone: within normal limits Gait & Station: normal Patient leans: N/A  Psychiatric Specialty Exam  Presentation  General Appearance:  Disheveled  Eye Contact: Fair  Speech: Blocked  Speech Volume: Normal  Handedness: Right   Mood and Affect  Mood: Depressed  Affect: Congruent   Thought Process  Thought Processes: Disorganized  Descriptions of Associations:Loose  Orientation:Partial (Oriented to person and place)  Thought Content:Other (comment) (Unable to assess)  Diagnosis of Schizophrenia or Schizoaffective disorder in past: No  Hallucinations:Hallucinations: Other (comment) (Appears to be responding to internal stimuli)  Ideas of Reference:None  Suicidal Thoughts:Suicidal Thoughts: No  Homicidal Thoughts:Homicidal Thoughts: No   Sensorium  Memory: Other (comment) (Unable to assess)  Judgment: Impaired  Insight: Lacking   Executive Functions  Concentration: Fair  Attention Span: Fair  Recall: Other (comment) (Unable to assess)  Fund of Knowledge: Fair  Language: Fair   Lexicographer Activity: Psychomotor Activity: Decreased   Assets  Assets: Housing; Social Support; Physical Health   Sleep  Sleep: Sleep: Good Number of Hours of Sleep: 9   No data recorded  Physical Exam  Physical Exam Constitutional:      Appearance: Normal appearance.  Eyes:     General:        Right eye: No discharge.        Left eye: No discharge.  Cardiovascular:     Rate and Rhythm: Normal rate.  Pulmonary:     Effort: Pulmonary effort is normal. No respiratory distress.  Musculoskeletal:         General: No swelling. Normal range of motion.     Cervical back: Normal range of motion.  Skin:    Coloration: Skin is not jaundiced or pale.  Neurological:     Mental Status: She is alert and oriented to person, place, and time.  Psychiatric:        Attention and Perception: Attention and perception normal.        Mood and Affect: Affect normal.        Speech: She is noncommunicative (select mute).        Behavior: Behavior is cooperative.        Thought Content: Thought content normal.        Cognition and Memory: Cognition normal.        Judgment: Judgment normal.    Review of Systems  Constitutional: Negative.   HENT: Negative.    Eyes: Negative.   Respiratory: Negative.    Cardiovascular: Negative.   Musculoskeletal: Negative.   Skin: Negative.   Neurological: Negative.    Blood pressure 97/69, pulse 64, temperature 98 F (36.7 C), temperature source Oral, resp. rate 17, SpO2 100%. There is no height or weight on file to calculate BMI.  Demographic Factors:  Adolescent or young adult  Loss Factors: NA  Historical Factors: NA  Risk Reduction Factors:   Sense of responsibility to family, Religious beliefs about death, Living with another person, especially a relative, Positive social support, Positive therapeutic relationship, and Positive coping skills or problem solving skills  Continued Clinical Symptoms:  Depression:   Impulsivity  Cognitive Features That Contribute To Risk:  None    Suicide Risk:  Minimal: No identifiable suicidal ideation.  Patients presenting with no risk factors but with morbid ruminations; may be classified as minimal risk based on the severity of the depressive symptoms  Plan Of Care/Follow-up recommendations:  Activity:  as tolerated  Diet:  regular   Disposition:  Discharge patient  Provided outpatient psychiatric resources for therapy and medication management.  Ardis Hughs, NP 04/12/2023, 2:41 PM

## 2023-04-12 NOTE — ED Notes (Signed)
Rn notified provider that patient refused the Zyprexa. No new orders were added

## 2023-04-12 NOTE — ED Notes (Signed)
Asked patient twice to take a shower during the day and she refused to answer and did not take a shower.

## 2023-04-12 NOTE — Discharge Instructions (Addendum)
Discharge recommendations:   Medications: Patient is to take medications as prescribed. The patient or patient's guardian is to contact a medical professional and/or outpatient provider to address any new side effects that develop. The patient or the patient's guardian should update outpatient providers of any new medications and/or medication changes.    Outpatient Follow up: Please review list of outpatient resources for psychiatry and counseling. Please follow up with your primary care provider for all medical related needs.    Therapy: We recommend that patient participate in individual therapy to address mental health concerns.   Atypical antipsychotics: If you are prescribed an atypical antipsychotic, it is recommended that your height, weight, BMI, blood pressure, fasting lipid panel, and fasting blood sugar be monitored by your outpatient providers.  Safety:   The following safety precautions should be taken:   No sharp objects. This includes scissors, razors, scrapers, and putty knives.   Chemicals should be removed and locked up.   Medications should be removed and locked up.   Weapons should be removed and locked up. This includes firearms, knives and instruments that can be used to cause injury.   The patient should abstain from use of illicit substances/drugs and abuse of any medications.  If symptoms worsen or do not continue to improve or if the patient becomes actively suicidal or homicidal then it is recommended that the patient return to the closest hospital emergency department, the [facility], or call 911 for further evaluation and treatment. National Suicide Prevention Lifeline 1-800-SUICIDE or 567-265-1791.  About 988 988 offers 24/7 access to trained crisis counselors who can help people experiencing mental health-related distress. People can call or text 988 or chat 988lifeline.org for themselves or if they are worried about a  loved one who may need crisis support.   Based on what you have shared, a list of resources for outpatient therapy and psychiatry is provided below to get you started back on treatment.  It is imperative that you follow through with treatment within 5-7 days from the day of discharge to prevent any further risk to your safety or mental well-being.  You are not limited to the list provided.  In case of an urgent crisis, you may contact the Mobile Crisis Unit with [facility] at 1.562-460-1106.        Outpatient Services for Therapy and Medication Management for [facility] [address], [place], [address] [phone] phone  New Patient Assessment/Therapy Walk-ins Monday and Wednesday: 8am until slots are full. Every 1st and 2nd Friday: 1pm - 5pm  NO ASSESSMENT/THERAPY WALK-INS ON TUESDAYS OR THURSDAYS  New Patient Psychiatry/Medication Management Walk-ins Monday-Friday: 8am-11am  For all walk-ins, we ask that you arrive by 7:30am because patient will be seen in the order of arrival.  Availability is limited; therefore, you may not be seen on the same day that you walk-in.  Our goal is to serve and meet the needs of our community to the best of our ability.   [facility] [address] [place], [place], [address] [phone] phone  [facility] [address] [place], [place], [address] [phone] phone [phone] phone ([address], Anthem/Elevance, [address], [address], [address], [address], Healthy [place], [place], [place], [address], [employer], [place], UHC, [employer], [place], Out of Network)  [employer], [place] [address] [place], [place], [address] [phone] phone (Monia Pouch, Anthem/Elevance, [employer], [place],  [address], [place], [place], [place], [place], [employer],  [place], [place], [place], [facility])  [employer] 970 438 [address] [place], [place], [address] [phone] phone (Medicaid, ask about other insurance)  [facility] [address] [place], [place], [address] [phone] phone [phone] fax ([address] [place] , [place], [place], [place] Health Choice, UHC, [employer] Electric, Self-Pay)  [facility] [address] [place], [place], [address] [phone] phone ([address] Anthem/Elevance, [address], [address], [place], [employer], [place], [place], [place], [employer], [place], [place], [place], [facility])  [facility] - 6-8 MONTH WAIT FOR THERAPY; SOONER FOR MEDICATION MANAGEMENT [address], [address] [place], [place], [address] [phone] phone ([address] AmeriHealth [address] [place], [address], [place], [place], Friday Health Plans, [address], BCBS Healthy [place], [place], [address], [place], [place], [place], [place], Tricare, UHC, [employer], [place])  [facility] [address], [address] [place], [place], [address] [phone] phone  [facility] [address], [address] [place], [place], [address] [phone] phone  [facility] [address] [place], [place], [address] [phone] phone  [facility] [place] - THERAPY ONLY [address] [place], [address] [phone] phone  [facility], [place] [address], [place], [address] [phone] phone  [facility] [address] [place], [place], [address] [phone] phone [phone] fax  [facility] [address] [place], [place], [address] [phone] phone [phone] fax  [facility] [phone] [address] [place], [place], [address] [phone] phone  Jovita Kussmaul [address] [place], [place], [address]  [phone] phone  [facility]   (Adults Only) [address] [place], [place], [address]  [phone] phone [phone] fax

## 2023-04-12 NOTE — ED Notes (Addendum)
Pt is refusing to sign Voluntary Admission for [facility] admission.  Dr Lucianne Muss and NP Vernard Gambles at bedside to speak with pt.

## 2023-04-20 ENCOUNTER — Emergency Department (HOSPITAL_COMMUNITY)
Admission: EM | Admit: 2023-04-20 | Discharge: 2023-04-22 | Disposition: A | Discharge status: Other Acute Inpatient Hospital | Payer: BC Managed Care – PPO | Attending: Emergency Medicine | Admitting: Emergency Medicine

## 2023-04-20 ENCOUNTER — Ambulatory Visit (HOSPITAL_COMMUNITY)
Admission: EM | Admit: 2023-04-20 | Discharge: 2023-04-20 | Disposition: A | Discharge status: Other Acute Inpatient Hospital | Payer: Medicaid Other | Attending: Psychiatry | Admitting: Psychiatry

## 2023-04-20 ENCOUNTER — Other Ambulatory Visit: Payer: Self-pay

## 2023-04-20 DIAGNOSIS — R4789 Other speech disturbances: Secondary | ICD-10-CM | POA: Diagnosis not present

## 2023-04-20 DIAGNOSIS — F29 Unspecified psychosis not due to a substance or known physiological condition: Secondary | ICD-10-CM | POA: Insufficient documentation

## 2023-04-20 DIAGNOSIS — Y900 Blood alcohol level of less than 20 mg/100 ml: Secondary | ICD-10-CM | POA: Insufficient documentation

## 2023-04-20 DIAGNOSIS — F061 Catatonic disorder due to known physiological condition: Secondary | ICD-10-CM | POA: Insufficient documentation

## 2023-04-20 DIAGNOSIS — R462 Strange and inexplicable behavior: Secondary | ICD-10-CM

## 2023-04-20 DIAGNOSIS — F23 Brief psychotic disorder: Secondary | ICD-10-CM | POA: Insufficient documentation

## 2023-04-20 NOTE — Progress Notes (Signed)
   04/20/23 1629  [facility] Triage Screening (Walk-ins at [facility] only)  How Did You Hear About [place]? Family/Friend  What Is the Reason for Your Visit/Call Today? Patient is a 17 year old female that presents to [facility] voluntarily accompanied by her parents with an altered mental state. Patient appears not to be able to process all the content of this writer's questions and just will nod her head "yes and no," to questions she seems to understand. Patient has moments where she answers verbally but very limited. Per chart review the patient was seen at [facility] on 10/19 for a brief psychotic disorder with catatonia . Patient denies any SA history.Patient denies any SI/HI or AVH at this time.  How Long Has This Been Causing You Problems? 1 wk - 1 month  Have You Recently Had Any Thoughts About Hurting Yourself? No  Are You Planning to Commit Suicide/Harm Yourself At This time? No  Have you Recently Had Thoughts About Hurting Someone Karolee Ohs? No  Are You Planning To Harm Someone At This Time? No  Are you currently experiencing any auditory, visual or other hallucinations? No  Have You Used Any Alcohol or Drugs in the Past 24 Hours? No  Do you have any current medical co-morbidities that require immediate attention? No  Clinician description of patient physical appearance/behavior: disconnected, guarded, having moments of just staring into space, wearing pajamas.  What Do You Feel Would Help You the Most Today? Treatment for Depression or other mood problem  If access to [facility] was not available, would you have sought care in the Emergency Department? No  Determination of Need Urgent (48 hours)  Options For Referral Inpatient Hospitalization;Medication Management;BH Urgent Care

## 2023-04-20 NOTE — ED Triage Notes (Signed)
Pt BIB [facility] from [facility]. EMS was called for pt being Catatonic. EMS stated the facility gave no further information. Pt is mute and not answering with words but will use gestures appropriately according to EMS

## 2023-04-20 NOTE — ED Notes (Signed)
Pt refused to let this RN collect blood. Pt also will not use words to answer questions. Pt only shakes head yes/no.

## 2023-04-20 NOTE — ED Provider Notes (Signed)
Terri Rich is a 17 year old female  who presents to [facility] voluntarily, accompanied by her mother  Loyola Mast  [phone] and her therapist Oneita Jolly [phone]. Her mother reports that patient has been having odd behaviors characterized by not communicating (mute), self neglect, not eating and not sleeping. Patient has been experiencing persecutory delusions  as well as command hallucinations. 4 days ago, patient wrote a note to her mother reporting the hallucinations and delusions.  Patient has been experiencing social withdrawal: stopped talking to friends, going to church or attending family gatherings. She has remained isolative in room, not eating, and has lost more than 10 lbs in a week.  Patient's father died a year and a half ago due to medical complications. Mother reports that patient was bullied when she was in high school "they said she smelled bad, and other ugly things..". Patient attempted to go to college but dropped in September. Patient does not use any substances.  Per chart review, patient was evaluated here  on 04/10/2023 and met criteria for inpatient. However, patient refused treatment and  requested to be discharged and Dr Guss Bunde recommended discharge. Patient went home and kept experiencing the symptoms.  Patient's mother was recommended by the church to seek therapy and today was her first day in therapy. Patient therapist  reports that he was unable to talk with her "because she refused to talk. He reports feeling concerned about patient's odd behaviors and her rapid weight lost. Patient has not eaten anything for 4 days.  Has not been sleeping or taking care of herself. There is no family mental health hx reported. Patient lives with her mother and her brother.   42 year old who appears bizarre, disheveled and  undernourished. She is guarded and selectively mute, and refuses to verbally participate in this assessment. She appears to be preoccupied. She is  alert and oriented  x 4. She has poor eye contact and her thought process in circumstantial. Provider unable to assess for suicidally.  Patient's mother and therapist are asked to step out of the room for a 1:1 assessment. Patient reveals that she quit school because she was hearing rumors about her: she refuses to provide details and returns to the mute mode.  Patient refuses to answer assessment questions.  Patient appears to be dehydrated as evidenced by her dry skin and mouth. She also admits that she has not been able to eat or drink anything for almost a week and refuses to discuss about it.   Patient's mother and therapist are concerned about her health condition as she has not been able to eat or drink anything for many days and has been losing too much weight.  .  Medical Clearance  is requested  by this provider who contacted Dr Adela Lank at [facility] ED and reported patient's condition.  Patient meets criteria for inpatient admission once medically cleared.

## 2023-04-20 NOTE — ED Notes (Signed)
EMS transport requested to [facility].  Report given to [employer].  Dr Melene Plan Accepting MD.

## 2023-04-20 NOTE — ED Provider Notes (Signed)
[place] EMERGENCY DEPARTMENT AT [facility] Provider Note   CSN: [id number] Arrival date & time: 04/20/23  2232     History {Add pertinent medical, surgical, social history, OB history to HPI:1} Chief Complaint  Patient presents with   Catatonic     KIERSYN DRUKER is a 17 y.o. female.  The history is provided by the patient and medical records.   17 year old female presenting to the ED from [facility] for medical clearance.  Patient has been evaluated multiple times recently at their facility for bizarre behavior, selective muteness, limited self care, etc.  Most recently at [facility] admission 04/10/23 she was recommended for IP hospitalization and once this was presented to her she began to speak and refused transfer to IP facility.  It was felt this was intentional and given OP resources.  Today, patient's mother took her back to [facility] due to persistent bizarre behavior, refusing to care for herself, not eating/drinking, not sleeping, etc.  Mother did try to get her in therapy recently, had her first session today but she refused to say anything to the therapist.  While it be hot she did intermittently speak with provider and indicated that she recently quit school because there were some "bullying" issues, afterwards then went back and began refusing to speak again.  She has been losing weight and mother is concerned about her overall physical state.  Patient is refusing to speak with me.  When I addressed her she does look at me and is intermittently picking at her fingernails.  She does shake her head yes or no and seems to understand but will not provide any verbal responses.  Home Medications Prior to Admission medications   Medication Sig Start Date End Date Taking? Authorizing Provider  cetirizine (ZYRTEC) 10 MG tablet Take 1 tablet (10 mg total) by mouth at bedtime. 09/12/17   Lowanda Foster, NP  dicyclomine (BENTYL) 10 MG capsule Take 1 capsule (10 mg total) by  mouth 3 (three) times daily as needed for spasms. 12/21/19   Viviano Simas, NP  ibuprofen (ADVIL,MOTRIN) 100 MG/5ML suspension Take 13.6 mLs (272 mg total) by mouth every 6 (six) hours as needed for mild pain or moderate pain. 05/10/14   Lurene Shadow, PA-C  magnesium oxide (MAG-OX) 400 (240 Mg) MG tablet Take 400 mg by mouth daily.    [provider]  ondansetron (ZOFRAN-ODT) 4 MG disintegrating tablet Take 1 tablet (4 mg total) by mouth every 8 (eight) hours as needed for nausea or vomiting. 05/10/14   Lurene Shadow, PA-C      Allergies    Patient has no known allergies.    Review of Systems   Review of Systems  Unable to perform ROS: Other    Physical Exam Updated Vital Signs There were no vitals taken for this visit.  Physical Exam Vitals and nursing note reviewed.  Constitutional:      Appearance: She is well-developed.     Comments: Awake, alert, picking at fingernails on exam  HENT:     Head: Normocephalic and atraumatic.  Eyes:     Conjunctiva/sclera: Conjunctivae normal.     Pupils: Pupils are equal, round, and reactive to light.  Cardiovascular:     Rate and Rhythm: Normal rate and regular rhythm.     Heart sounds: Normal heart sounds.  Pulmonary:     Effort: Pulmonary effort is normal.     Breath sounds: Normal breath sounds.  Abdominal:     General: Bowel  sounds are normal.     Palpations: Abdomen is soft.  Musculoskeletal:        General: Normal range of motion.     Cervical back: Normal range of motion.  Skin:    General: Skin is warm and dry.  Neurological:     Mental Status: She is alert.     Comments: Awake, alert, she does turn her attention to provider when spoken to, she will shake head yes/no to selective questions, others she just continues picking her fingernails, she will not provide any verbal responses, she is spontaneously moving arms/legs witnessed from afar, will not move extremities on command when prompted     ED Results /  Procedures / Treatments   Labs (all labs ordered are listed, but only abnormal results are displayed) Labs Reviewed  CBC WITH DIFFERENTIAL/PLATELET  COMPREHENSIVE METABOLIC PANEL  ETHANOL  RAPID URINE DRUG SCREEN, HOSP PERFORMED  SALICYLATE LEVEL  ACETAMINOPHEN LEVEL  HCG, SERUM, QUALITATIVE    EKG None  Radiology No results found.  Procedures Procedures  {Document cardiac monitor, telemetry assessment procedure when appropriate:1}  Medications Ordered in ED Medications - No data to display  ED Course/ Medical Decision Making/ A&P   {   Click here for ABCD2, HEART and other calculatorsREFRESH Note before signing :1}                              Medical Decision Making Amount and/or Complexity of Data Reviewed Labs: ordered. ECG/medicine tests: ordered.   ***  {Document critical care time when appropriate:1} {Document review of labs and clinical decision tools ie heart score, Chads2Vasc2 etc:1}  {Document your independent review of radiology images, and any outside records:1} {Document your discussion with family members, caretakers, and with consultants:1} {Document social determinants of health affecting pt's care:1} {Document your decision making why or why not admission, treatments were needed:1} Final Clinical Impression(s) / ED Diagnoses Final diagnoses:  None    Rx / DC Orders ED Discharge Orders     None

## 2023-04-20 NOTE — ED Notes (Signed)
Unable to document proper GCS. Pt is not speaking at this time. Pt is alert and sitting up in the bed at this time. No Distress noted.

## 2023-04-21 ENCOUNTER — Encounter (HOSPITAL_COMMUNITY): Payer: Self-pay | Admitting: Psychiatry

## 2023-04-21 DIAGNOSIS — F29 Unspecified psychosis not due to a substance or known physiological condition: Secondary | ICD-10-CM | POA: Diagnosis not present

## 2023-04-21 LAB — CBC WITH DIFFERENTIAL/PLATELET
Abs Immature Granulocytes: 0.01 10*3/uL (ref 0.00–0.07)
Basophils Absolute: 0 10*3/uL (ref 0.0–0.1)
Basophils Relative: 1 %
Eosinophils Absolute: 0.1 10*3/uL (ref 0.0–0.5)
Eosinophils Relative: 2 %
HCT: 39.1 % (ref 36.0–46.0)
Hemoglobin: 12.6 g/dL (ref 12.0–15.0)
Immature Granulocytes: 0 %
Lymphocytes Relative: 30 %
Lymphs Abs: 1.9 10*3/uL (ref 0.7–4.0)
MCH: 28.4 pg (ref 26.0–34.0)
MCHC: 32.2 g/dL (ref 30.0–36.0)
MCV: 88.3 fL (ref 80.0–100.0)
Monocytes Absolute: 0.3 10*3/uL (ref 0.1–1.0)
Monocytes Relative: 5 %
Neutro Abs: 4.1 10*3/uL (ref 1.7–7.7)
Neutrophils Relative %: 62 %
Platelets: 217 10*3/uL (ref 150–400)
RBC: 4.43 MIL/uL (ref 3.87–5.11)
RDW: 12.6 % (ref 11.5–15.5)
WBC: 6.5 10*3/uL (ref 4.0–10.5)
nRBC: 0 % (ref 0.0–0.2)

## 2023-04-21 LAB — COMPREHENSIVE METABOLIC PANEL
ALT: 21 U/L (ref 0–44)
AST: 19 U/L (ref 15–41)
Albumin: 4.1 g/dL (ref 3.5–5.0)
Alkaline Phosphatase: 51 U/L (ref 38–126)
Anion gap: 15 (ref 5–15)
BUN: 18 mg/dL (ref 6–20)
CO2: 21 mmol/L — ABNORMAL LOW (ref 22–32)
Calcium: 9.7 mg/dL (ref 8.9–10.3)
Chloride: 105 mmol/L (ref 98–111)
Creatinine, Ser: 0.7 mg/dL (ref 0.44–1.00)
GFR, Estimated: >60 mL/min (ref >60)
Glucose, Bld: 88 mg/dL (ref 70–99)
Potassium: 3.9 mmol/L (ref 3.5–5.1)
Sodium: 141 mmol/L (ref 135–145)
Total Bilirubin: 1.1 mg/dL (ref 0.3–1.2)
Total Protein: 7.6 g/dL (ref 6.5–8.1)

## 2023-04-21 LAB — HCG, SERUM, QUALITATIVE: Preg, Serum: NEGATIVE

## 2023-04-21 LAB — RAPID URINE DRUG SCREEN, HOSP PERFORMED
Amphetamines: NOT DETECTED
Barbiturates: NOT DETECTED
Benzodiazepines: NOT DETECTED
Cocaine: NOT DETECTED
Opiates: NOT DETECTED
Tetrahydrocannabinol: NOT DETECTED

## 2023-04-21 LAB — SALICYLATE LEVEL: Salicylate Lvl: <7 mg/dL — ABNORMAL LOW (ref 7.0–30.0)

## 2023-04-21 LAB — ETHANOL: Alcohol, Ethyl (B): <10 mg/dL (ref <10)

## 2023-04-21 LAB — ACETAMINOPHEN LEVEL: Acetaminophen (Tylenol), Serum: <10 ug/mL — ABNORMAL LOW (ref 10–30)

## 2023-04-21 MED ORDER — LORAZEPAM 2 MG/ML IJ SOLN
1.0000 mg | Freq: Once | INTRAMUSCULAR | Status: AC
  Administered 2023-04-21: 1 mg via INTRAMUSCULAR
  Filled 2023-04-21: qty 1

## 2023-04-21 MED ORDER — LORAZEPAM 2 MG/ML IJ SOLN: 2.0000 mg | Freq: Once | INTRAMUSCULAR | Status: DC

## 2023-04-21 MED ORDER — OLANZAPINE 5 MG PO TABS
5.0000 mg | ORAL_TABLET | Freq: Every day | ORAL | Status: DC
  Filled 2023-04-21: qty 1

## 2023-04-21 MED ORDER — LORAZEPAM 2 MG/ML IJ SOLN: 1.0000 mg | Freq: Once | INTRAMUSCULAR | Status: DC

## 2023-04-21 NOTE — ED Notes (Signed)
Pt verbal with staff.

## 2023-04-21 NOTE — ED Notes (Signed)
Patient continuing to come to nurses station wanting to leave. RN told her she could not leave and she acted like she was going back to her room but instead followed people out of a door trying to find a exit. Security called for assistance patient not responding to redirection. No sitters available.

## 2023-04-21 NOTE — ED Notes (Signed)
Pt continues to walk up to nurses station and ask if she can have her clothes and go home. Pt once again told she has to wait on [facility] provider to assess her.

## 2023-04-21 NOTE — ED Notes (Signed)
Patient coming to nurses station, explained to her that she has to wait on the doctor to see her she can't leave.

## 2023-04-21 NOTE — ED Notes (Signed)
Patient sitting on side of bed.  Currently watching TV

## 2023-04-21 NOTE — Progress Notes (Signed)
Pt meets inpatient behavioral health placement per Shearon Stalls. CSW has requested [facility] Fransico Michael, RN to review. CSW/Disposition team will assist and follow with placement.    Maryjean Ka, MSW, LCSWA 04/21/2023 9:24 PM

## 2023-04-21 NOTE — ED Notes (Addendum)
Pt wandering the halls. Pt non verbal. Pt is writing on a piece of paper for communication. Pt is asking for "waiver to leave." Informed pt we are waiting on [facility] provider to see her.

## 2023-04-21 NOTE — ED Notes (Signed)
Pt trying to walk out the door. Pt not redirectable. Pt will not stay in room. Administered ativan per order

## 2023-04-21 NOTE — ED Notes (Signed)
Pt uncooperative while showering. Pt refusing to get out of shower. RN de escalated situation and encourage pt to get out of shower. Pt agreed to get out of shower and get dressed after de escalating situation. Pt escorted back to bed. Pt in bed eating dinner comfortably.

## 2023-04-21 NOTE — ED Notes (Signed)
IVC Paperwork completed, expires 11/5.

## 2023-04-21 NOTE — ED Notes (Signed)
Pt still attempting to leave unit. This time becoming more aggressive and pushing past staff. MD aware

## 2023-04-21 NOTE — Consult Note (Cosign Needed Addendum)
BH ED ASSESSMENT   Reason for Consult: Psych Consult  Referring Physician:   Garlon Hatchet, PA-C   Patient Identification: Terri Rich MRN:  [id number] ED Chief Complaint: Unspecified psychosis not due to a substance or known physiological condition (HCC)  Diagnosis:  Principal Problem:   Unspecified psychosis not due to a substance or known physiological condition [facility])   ED Assessment Time Calculation: Start Time: 1800 Stop Time: 1833 Total Time in Minutes (Assessment Completion): 33   Subjective:    Terri Rich is a 17 y.o. 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 Patient currently is under involuntary commitment and medically clear per EDP team.  HPI:   Patient seen today at the [facility] emergency department for face-to-face psychiatric evaluation.  Upon evaluation, engagement largely characterized by completely nonverbal communication i.e. patient shook her head yes or no to all questions and answers, atypical interpersonal style with appreciable psychomotor slowing, appreciable responding to internal stimuli, and limited overall engagement with denial of any instability of her mental health and/or need to be in the safe and secure environment of the hospital.  While shaking her head yes or no to questions asked, patient endorsed an euthymic mood with a largely neutral affect and/or if mildly constricted affect, and variable to brief eye contact.  Patient endorses when asked orientation questions all of the answers correctly, no concerns for fluctuations in consciousness noted.  Patient endorsed no suicidal or homicidal ideations.  Patient endorsed no auditory and/or visual  hallucinations, but objectively, the patient appeared to be responding to internal stimuli (I.e., constantly looking around the room appearing to be seeing things and hearing things).  Patient endorsed eating and sleeping normally.  Patient endorsed that she felt that she did not need to be in the safe and secure environment of the hospital.  Patient denied any psychosocial stressors in her life.  Patient endorses no drug use, tobacco use, and/or EtOH use (UDS notably negative).  Patient allowed this writer to perform catatonia evaluation i.e. formal Loni Beckwith (unremarkable), then terminated our interview.   Call placed to the patient's mother Loyola Mast  @ [phone] who affirms the information provided at the [facility]   Per Marlou Sa, NP @ [facility] Collateral obtained from Mother Loyola Mast  [phone] and her therapist Oneita Jolly [phone].   Her mother reports that patient has been having odd behaviors characterized by not communicating (mute), self neglect, not eating and not sleeping. Patient has been experiencing persecutory delusions  as well as command hallucinations. 4 days ago, patient wrote a note to her mother reporting the hallucinations and delusions.  Patient has been experiencing social withdrawal: stopped talking to friends, going to church or attending family gatherings. She has remained isolative in room, not eating, and has lost more than 10 lbs in a week.  Patient's father died a year and a half ago due to medical complications. Mother reports that patient was bullied when she was in high school "they said she smelled bad, and other ugly things..". Patient attempted to go to college but dropped in September. Patient does not use any substances.   Past Psychiatric History: Brief psychotic disorder, bizarre behavior  Risk to Self or Others: Is the patient at risk  to self? Yes Has the patient been a risk to self in the past 6 months? Yes Has the patient  been a risk to self within the distant past? No Is the patient a risk to others? No Has the patient been a risk to others in the past 6 months? No Has the patient been a risk to others within the distant past? No  [place] Scale:  Flowsheet Row ED from 04/20/2023 in [facility] ED from 04/10/2023 in [facility]  C-SSRS RISK CATEGORY No Risk No Risk      Substance Abuse: None endorsed or reported  Past Medical History: History reviewed. No pertinent past medical history. History reviewed. No pertinent surgical history. Family History: History reviewed. No pertinent family history.  Family Psychiatric  History: Per [facility] 04/10/2023 visit -Aunt - unknown mental health "episode"   Social History:  Social History   Substance and Sexual Activity  Alcohol Use No     Social History   Substance and Sexual Activity  Drug Use No    Social History   Socioeconomic History   Marital status: Single    Spouse name: Not on file   Number of children: Not on file   Years of education: Not on file   Highest education level: Not on file  Occupational History   Not on file  Tobacco Use   Smoking status: Never   Smokeless tobacco: Never  Substance and Sexual Activity   Alcohol use: No   Drug use: No   Sexual activity: Not on file  Other Topics Concern   Not on file  Social History Narrative   Not on file   Social Determinants of Health   Financial Resource Strain: Not on File (12/12/2021)   Received from [employer]    Financial Resource Strain: 0  Food Insecurity: No Food Insecurity (04/10/2023)   Hunger Vital Sign    Worried About Running Out of Food in the Last Year: Never true    Ran Out of Food in the Last Year: Never true  Transportation Needs: No Transportation Needs (04/10/2023)   PRAPARE - Administrator, Civil Service (Medical): No    Lack of Transportation (Non-Medical): No   Physical Activity: Not on File (12/12/2021)   Received from [facility]   Physical Activity    Physical Activity: 0  Stress: Not on File (12/12/2021)   Received from [facility]   Stress    Stress: 0  Social Connections: Not on File (03/05/2023)   Received from [facility]   Social Connections    Connectedness: 0   Additional Social History:    Allergies:  No Known Allergies  Labs:  Results for orders placed or performed during the hospital encounter of 04/20/23 (from the past 48 hour(s))  CBC with Differential     Status: None   Collection Time: 04/21/23  2:41 AM  Result Value Ref Range   WBC 6.5 4.0 - 10.5 K/uL   RBC 4.43 3.87 - 5.11 MIL/uL   Hemoglobin 12.6 12.0 - 15.0 g/dL   HCT 40.9 81.1 - 91.4 %   MCV 88.3 80.0 - 100.0 fL   MCH 28.4 26.0 - 34.0 pg   MCHC 32.2 30.0 - 36.0 g/dL   RDW 78.2 95.6 - 21.3 %   Platelets 217 150 - 400 K/uL   nRBC 0.0 0.0 - 0.2 %   Neutrophils Relative % 62 %   Neutro Abs 4.1  1.7 - 7.7 K/uL   Lymphocytes Relative 30 %   Lymphs Abs 1.9 0.7 - 4.0 K/uL   Monocytes Relative 5 %   Monocytes Absolute 0.3 0.1 - 1.0 K/uL   Eosinophils Relative 2 %   Eosinophils Absolute 0.1 0.0 - 0.5 K/uL   Basophils Relative 1 %   Basophils Absolute 0.0 0.0 - 0.1 K/uL   Immature Granulocytes 0 %   Abs Immature Granulocytes 0.01 0.00 - 0.07 K/uL    Comment: Performed at [facility], [address] [place], [place] [address]  Comprehensive metabolic panel     Status: Abnormal   Collection Time: 04/21/23  2:41 AM  Result Value Ref Range   Sodium 141 135 - 145 mmol/L   Potassium 3.9 3.5 - 5.1 mmol/L   Chloride 105 98 - 111 mmol/L   CO2 21 (L) 22 - 32 mmol/L   Glucose, Bld 88 70 - 99 mg/dL    Comment: Glucose reference range applies only to samples taken after fasting for at least 8 hours.   BUN 18 6 - 20 mg/dL   Creatinine, Ser 2.95 0.44 - 1.00 mg/dL   Calcium 9.7 8.9 - 62.1 mg/dL   Total Protein 7.6 6.5 - 8.1 g/dL   Albumin 4.1 3.5 - 5.0 g/dL   AST 19 15 - 41 U/L    ALT 21 0 - 44 U/L   Alkaline Phosphatase 51 38 - 126 U/L   Total Bilirubin 1.1 0.3 - 1.2 mg/dL   GFR, Estimated >30 >86 mL/min    Comment: (NOTE) Calculated using the CKD-EPI Creatinine Equation (2021)    Anion gap 15 5 - 15    Comment: Performed at [facility], [address] [place], [place] [address]  Ethanol     Status: None   Collection Time: 04/21/23  2:41 AM  Result Value Ref Range   Alcohol, Ethyl (B) <10 <10 mg/dL    Comment: (NOTE) Lowest detectable limit for serum alcohol is 10 mg/dL.  For medical purposes only. Performed at [facility], [address], [place], [place] [address]   Salicylate level     Status: Abnormal   Collection Time: 04/21/23  2:41 AM  Result Value Ref Range   Salicylate Lvl <7.0 (L) 7.0 - 30.0 mg/dL    Comment: Performed at [facility], [address], [place], [place] [address]  Acetaminophen level     Status: Abnormal   Collection Time: 04/21/23  2:41 AM  Result Value Ref Range   Acetaminophen (Tylenol), Serum <10 (L) 10 - 30 ug/mL    Comment: (NOTE) Therapeutic concentrations vary significantly. A range of 10-30 ug/mL  may be an effective concentration for many patients. However, some  are best treated at concentrations outside of this range. Acetaminophen concentrations >150 ug/mL at 4 hours after ingestion  and >50 ug/mL at 12 hours after ingestion are often associated with  toxic reactions.  Performed at [facility], [address], [place], [place] [address]   hCG, serum, qualitative     Status: None   Collection Time: 04/21/23  2:41 AM  Result Value Ref Range   Preg, Serum NEGATIVE NEGATIVE    Comment:        THE SENSITIVITY OF THIS METHODOLOGY IS >10 mIU/mL. Performed at [facility], [address] [place], [place] [address]   Rapid urine drug screen (hospital performed)     Status: None   Collection  Time: 04/21/23  3:36 AM  Result Value Ref Range   Opiates NONE DETECTED NONE DETECTED    Cocaine NONE DETECTED NONE DETECTED   Benzodiazepines NONE DETECTED NONE DETECTED   Amphetamines NONE DETECTED NONE DETECTED   Tetrahydrocannabinol NONE DETECTED NONE DETECTED   Barbiturates NONE DETECTED NONE DETECTED    Comment: (NOTE) DRUG SCREEN FOR MEDICAL PURPOSES ONLY.  IF CONFIRMATION IS NEEDED FOR ANY PURPOSE, NOTIFY LAB WITHIN 5 DAYS.  LOWEST DETECTABLE LIMITS FOR URINE DRUG SCREEN Drug Class                     Cutoff (ng/mL) Amphetamine and metabolites    1000 Barbiturate and metabolites    200 Benzodiazepine                 200 Opiates and metabolites        300 Cocaine and metabolites        300 THC                            50 Performed at [facility], [address], [place], [place] [address]     Current Facility-Administered Medications  Medication Dose Route Frequency Provider Last Rate Last Admin   LORazepam (ATIVAN) injection 1 mg  1 mg Intramuscular Once Tegeler, Canary Brim, MD       No current outpatient medications on file.    Musculoskeletal: Strength & Muscle Tone: decreased Gait & Station: normal Patient leans: N/A   Psychiatric Specialty Exam: Presentation  General Appearance:  Other (comment) (Atypical interpersonal style)  Eye Contact: Other (comment) (Variable to brief)  Speech: Other (comment) (Mute)  Speech Volume: Other (comment) (None)  Handedness: Right   Mood and Affect  Mood: Euthymic  Affect: Other (comment) (Neutral)   Thought Process  Thought Processes: Other (comment) (Unable to assess, mute)  Descriptions of Associations:-- (Unable to assess, mute)  Orientation:Full (Time, Place and Person)  Thought Content:Other (comment) (Unable to assess, mute)  History of Schizophrenia/Schizoaffective disorder:No  Duration of Psychotic Symptoms:Less than six months  Hallucinations:Hallucinations: Other (comment) (Denied, but appeared to be RTIS) Description of Auditory Hallucinations: "rumors  going around"  Ideas of Reference:None  Suicidal Thoughts:Suicidal Thoughts: No  Homicidal Thoughts:Homicidal Thoughts: No   Sensorium  Memory: Other (comment) (Unable to assess, mute)  Judgment: Intact  Insight: Lacking   Executive Functions  Concentration: Fair  Attention Span: Fair  Recall: Other (comment) (Unable to assess, mute)  Fund of Knowledge: Other (comment) (Unable to assess, mute)  Language: Other (comment) (Unable to assess, mute)   Psychomotor Activity  Psychomotor Activity: Psychomotor Activity: Normal   Assets  Assets: Housing; Leisure Time; Physical Health; Social Support    Sleep  Sleep: Sleep: Good Number of Hours of Sleep: 0 (no sleep for the past 4 days)   Physical Exam: Physical Exam Vitals and nursing note reviewed.  Constitutional:      General: She is not in acute distress.    Appearance: She is not ill-appearing, toxic-appearing or diaphoretic.     Comments: cachectic  Pulmonary:     Effort: Pulmonary effort is normal.  Skin:    General: Skin is warm and dry.  Neurological:     Mental Status: She is alert and oriented to person, place, and time.  Psychiatric:        Attention and Perception: She is inattentive (Appeared to be RTIS).  Mood and Affect: Mood and affect normal.        Speech: She is noncommunicative.        Behavior: Behavior is slowed and withdrawn. Behavior is cooperative.        Thought Content: Thought content is not paranoid or delusional. Thought content does not include homicidal or suicidal ideation.        Judgment: Judgment is inappropriate.    Review of Systems  All other systems reviewed and are negative.  Blood pressure 106/70, pulse 68, temperature 98 F (36.7 C), temperature source Oral, resp. rate 13, SpO2 100%. There is no height or weight on file to calculate BMI.  Medical Decision Making:  Patient presented this encounter by way of EMS transport from the [facility], where the  patient originally presented by way of parents due to concerns for altered mental status.  Upon evaluation, engagement largely characterized by completely nonverbal communication i.e. patient shook her head yes or no to all questions asked, atypical interpersonal style with appreciable psychomotor slowing, appreciable responding to internal stimuli, and limited overall engagement, with denial of any instability of her mental health and/or need to be in the safe and secure environment of the hospital.   Etiologically, it is unclear at this time what exactly is the cause of the patient's current presentation, however, given collateral information obtained from the patient's family, as well as evaluation today, the patient presents as an imminent risk to her self and meets inpatient hospitalization criteria at this time, thus the recommendation at this time is for inpatient mental health hospitalization, and the additional recommendations below.  Spoke with patient's mother who is in agreement with plan of care to move forward.  Recommendations  # Unspecified psychosis  -Recommend inpatient mental health hospitalization -Recommend continue involuntary commitment -Recommend safety precautions -Recommend olanzapine 5 mg p.o. nightly  Disposition: Recommend psychiatric Inpatient admission when medically cleared.  Lenox Ponds, NP 04/21/2023 6:33 PM

## 2023-04-21 NOTE — ED Notes (Signed)
Pt eating lunch

## 2023-04-21 NOTE — ED Notes (Signed)
Pt not talking at this time.

## 2023-04-21 NOTE — ED Notes (Signed)
Pt continues to wander the hallway at nurses station. Charge nurse aware of need for sitter. No sitters available.

## 2023-04-21 NOTE — ED Notes (Addendum)
Pt requesting shower. Pt escorted to shower.

## 2023-04-21 NOTE — ED Notes (Signed)
Findings has been uploaded to the chart and patient has moved from yellow zone to purple copies now placed on clipboard in purple zone

## 2023-04-21 NOTE — ED Notes (Signed)
Patient with no sitter at this time.  Pt currently calm and cooperative.  Will continue to monitor

## 2023-04-21 NOTE — ED Notes (Signed)
Pts belongings place in locker 2. Pt informed of policy with being IVC. Pt continues to be nonverbal and only respond with shaking head.

## 2023-04-21 NOTE — ED Notes (Signed)
Patient hesitant about taking medication.  Explained plan of care and reason for medication.  Patient wants to wait at this time and will let sitter know if she will take.  Will continue to assess and reevaluate

## 2023-04-21 NOTE — ED Notes (Signed)
Patient unwilling to take medication at this time.  Will verbally communicate with minimal words.  Currently calm and cooperative.  Will continue to monitor

## 2023-04-22 DIAGNOSIS — F29 Unspecified psychosis not due to a substance or known physiological condition: Secondary | ICD-10-CM | POA: Diagnosis not present

## 2023-04-22 LAB — URINALYSIS, ROUTINE W REFLEX MICROSCOPIC
Bilirubin Urine: NEGATIVE
Glucose, UA: NEGATIVE mg/dL
Hgb urine dipstick: NEGATIVE
Ketones, ur: 20 mg/dL — AB
Nitrite: NEGATIVE
Protein, ur: NEGATIVE mg/dL
Specific Gravity, Urine: 1.026 (ref 1.005–1.030)
pH: 5 (ref 5.0–8.0)

## 2023-04-22 NOTE — ED Notes (Signed)
Report called to Barb Merino, RN at [facility].

## 2023-04-22 NOTE — ED Notes (Addendum)
Pt repeatedly at desk asking for her clothes, take out food and the Dr. By writing on paper.  Pt continues to refuse to talk with staff.  Pt did speak with Engineer, materials briefly. Pt needs addressed, redirected frequently.

## 2023-04-22 NOTE — Progress Notes (Signed)
LCSW Progress Note  [id number]   Terri Rich  04/22/2023  7:02 AM    Inpatient Behavioral Health Placement  Pt meets inpatient criteria per Arsenio Loader, NP. There are no available beds within [facility] system per [facility] AC Kim Brooks,RN. Referral was sent to the following facilities;   Destination  Service Provider Address Phone [facility]  [address] [place], [place] [address] [phone]  [facility]  [address] [place] [address] [phone]  [facility]  [address], [place] [address] [phone]  [facility]  [address] [place] [address] [phone]  [facility]  [address] [place] [address] [phone]  [facility]  [address] [place] [address] [phone]  [facility]  [address] [place] [address] [phone]  [facility]  [address], [place] [address] [phone]  [facility]  [address], [place] [address] [phone]  [facility]  [address] [place] [address] [phone]  [facility]  [address] [place] [address] [phone]  [facility]  [address] [place] [address] [phone]  [facility]  [address] [place] [address] [phone]  [facility]  [address] [place], [place] [phone]  [facility]  [address], [place] [address] [phone]  [facility]  [address], [place] [address] [phone]  [facility]  [address], [place] [address] [phone]  [facility]  [address] [place] [address] [phone]  [facility]  [address], [place] [address] [phone]  [facility]patient [facility]  [place] [phone]  [facility]  [address], [place] [address] [phone]  [facility] Patient Placement  [facility], [place] [phone]  [facility]  [place] [phone]  [facility]  [address] [place] [address] [phone]    Situation ongoing,  CSW will follow up.    Maryjean Ka, MSW, LCSWA 04/22/2023 7:02 AM

## 2023-04-22 NOTE — Progress Notes (Signed)
Pt has been accepted to  [facility] Bed assignment: [facility] Unit   Pt meets inpatient criteria per: Arsenio Loader NP  Attending Physician will be: Dudley Major MD  Report can be called to: [phone]   Pt can arrive after U/A   Care Team Notified: Toney Sang RN, Bethany Hendra LCSW Arsenio Loader NP  [place] Kaeden Depaz LCSW-A   04/22/2023 12:48 PM

## 2023-04-22 NOTE — ED Notes (Signed)
Pt mother in for visit.  Questions answered, information shared with mother concerning transfer to [facility] [place].  Answers provided to mother's satisfaction.  Awaiting urine results prior to transport.

## 2023-04-22 NOTE — Progress Notes (Signed)
Pt has been accepted to  [facility] Bed assignment: [facility] Unit   Pt meets inpatient criteria per: Arsenio Loader NP  Attending Physician will be: Dudley Major MD  Report can be called to: [phone]   Pt can arrive after U/A   Care Team Notified: Toney Sang RN, Bethany Hendra LCSW Arsenio Loader NP  [place] Raymundo Rout LCSW-A   04/22/2023 3:00 PM

## 2023-04-22 NOTE — ED Notes (Signed)
Patient out to nursing station.  Will not speak when asked if she needs anything.  Offered a warm blanket and patient accepted.  Patient provided with warm blankets and returned to room.  Will continue to monitor

## 2023-04-22 NOTE — Progress Notes (Signed)
CSW received phone call and pt is under review at [facility]. 1st shift Dispo team to assist and follow up.  Maryjean Ka, MSW, [facility] 04/22/2023 7:24 AM

## 2023-04-22 NOTE — ED Notes (Signed)
Patient out to nursing station requesting to "sign a waiver."  Patient informed that she is not able to sign a waiver to leave.  Patient will return to room and then comes back out to nursing station requesting to "sign a waiver" again.

## 2023-04-22 NOTE — Progress Notes (Signed)
Pt has been accepted to  [facility] Bed assignment: [facility] Unit   Pt meets inpatient criteria per: Arsenio Loader NP  Attending Physician will be: Dudley Major MD  Report can be called to: [phone]   Pt can arrive after U/A   Care Team Notified: Toney Sang RN, Bethany Hendra LCSW Arsenio Loader NP

## 2023-04-22 NOTE — Progress Notes (Signed)
   04/22/23 1700  Spiritual Encounters  Type of Visit Initial  Care provided to: Patient  Conversation partners present during Programmer, systems;Other (comment)  Referral source Nurse (RN/NT/LPN)  Reason for visit Routine spiritual support  OnCall Visit Yes   Ch responded to request for emotional and spiritual support. There was no parent at bedside. Chaplain provided compassionate presence and asked Terrika guided questions to bring up feelings. Patient asked for prayer. Ch offered a word of prayer for patient and staff. Ch remains available when needed.

## 2023-04-22 NOTE — ED Provider Notes (Signed)
Emergency Medicine Observation Re-evaluation Note  Terri Rich is a 17 y.o. female, seen on rounds today.  Pt initially presented to the ED for complaints of Catatonic  Currently, the patient is sitting in bed. Denies complaints non verbally.  Physical Exam  BP 104/84 (BP Location: Left Arm)   Pulse (!) 56   Temp 98 F (36.7 C) (Oral)   Resp 15   SpO2 100%  Physical Exam General: no distress Cardiac: regular rate Lungs: equal chest rise Psych: calm  ED Course / MDM  EKG:EKG Interpretation Date/Time:  Wednesday April 21 2023 08:13:01 EDT Ventricular Rate:  56 PR Interval:  102 QRS Duration:  70 QT Interval:  418 QTC Calculation: 403 R Axis:   82  Text Interpretation: Sinus bradycardia with short PR Nonspecific ST and T wave abnormality Abnormal ECG When compared with ECG of 10-Apr-2023 13:58, PREVIOUS ECG IS PRESENT Confirmed by Virgina Norfolk [phone]) on 04/21/2023 8:20:07 AM  I have reviewed the labs performed to date as well as medications administered while in observation.  Recent changes in the last 24 hours include recommended for psychiatric placement.  Plan  Current plan is for psychiatric placement.    Lonell Grandchild, MD 04/22/23 [phone]

## 2023-04-22 NOTE — ED Notes (Signed)
Patient returned to nursing station.  Stated "Can I please?"  Would not say any other words.  Patient asked to return to room.  Informed that if she had a need, then she would need to let staff know and they would be happy to help address it.

## 2023-04-22 NOTE — Consult Note (Addendum)
Patient attempted to be seen today for psychiatric reevaluation at the [facility] emergency department.  Upon reevaluation attempt, patient attempted to be evaluated utilizing having the patient shake her head yes or no to questions asked, as well as attempted to have the patient go to her room to speak to this provider, but patient after numerous attempts to perform reevaluation, ultimately continued to refuse and was unable to be seen properly.  Per Nursing: Patient has been noncompliant with medications, did not sleep all night, has remained mute, and has been nonamenable to care measures.  Patient has been appreciably accepted to [facility], coordination in process for transfer by staff.

## 2023-04-22 NOTE — ED Notes (Signed)
Patient ambulatory to restroom.  Returns to room, then back to nursing station.  Will not speak at this time.  Looking around department at doors.  Encouraged to return to room.  Patient will walk to room and then back to nursing station.

## 2023-04-22 NOTE — ED Notes (Signed)
Patient at nursing station.  Will not speak.  Given crayon and paper to help with communicating.  Patient writes "sign the waiver."  Patient informed that she is unable to sign a waiver and that she is unable to leave.  Requesting to have her belongings returned.  Patient again reminded that she is under IVC.

## 2023-04-22 NOTE — ED Notes (Signed)
Patient returned to room for short amount of time.  Returned to nursing station requesting to know what time she would be able to leave.  Patient informed that she would not be able to leave.

## 2023-04-26 NOTE — Progress Notes (Signed)
Brief psychotic disorder with catatonia

## 2023-06-08 ENCOUNTER — Other Ambulatory Visit: Payer: Self-pay

## 2023-12-06 ENCOUNTER — Other Ambulatory Visit (HOSPITAL_COMMUNITY): Payer: Self-pay | Admitting: Psychiatry

## 2023-12-06 NOTE — Progress Notes (Unsigned)
 Psychiatric Initial Adult Assessment   Patient Identification: Terri Rich MRN:  [id number] Date of Evaluation:  12/07/2023 Referral Source: PCP Chief Complaint:   Chief Complaint  Patient presents with   Follow-up   Visit Diagnosis:    ICD-10-CM   1. Unspecified psychosis not due to a substance or known physiological condition (HCC)  F29 OLANZapine  (ZYPREXA ) 5 MG tablet    LORazepam  (ATIVAN ) 1 MG tablet      Assessment:  Terri Rich is a 18 y.o. female with a history of unspecified psychotic disorder, catatonia, anorexia, ovarian teratoma, and suspicion of anti-NMDA receptor encephalitis who presents in person to [facility] at [facility] for initial evaluation on 12/07/2023.  She had been hospitalized for 2-1/53-month period in December 2024 during which she received 15 rounds of ECT.  At initial evaluation patient presented with moderate speech latency, mostly nonverbal, flat affect, limited insight and was minimally engaged.  She had moderate mutism, inhibited movements, posturing, rigidity, and staring during evaluation.  Thought content was vague and patient seemed to express paranoid ideation particularly in relation to medications.  Based off chart review patient had been more vocal and engaged at time of discharge so there was still some concern about paranoia.  She had been discharged on medications but has not taken the olanzapine  since discharge and stop the Ativan  at the beginning of April.  Weight is currently stable at 123 pounds though patient has lost roughly 13 pounds since neurology visit in April.  In addition patient had suspicion of anti-NMDA encephalitis which may have triggered her catatonic episode several months ago.  Given limited history and patient's apparent decline since discharge hospitalization it would be appropriate to restart on psychiatric medications today.  Patient has extreme hesitancy bordering on  paranoia with the medications but did ultimately agree to restart the Ativan  at 1 mg 3 times daily and Zyprexa  5 mg at bedtime.  We would recommend follow up in a month as well as reconnecting with neurology for evaluation.  Patient also had endorsed chronic headaches for the past 4 months and could benefit from neurology assessment.  Risk Assessment: A suicide and violence risk assessment was performed as part of this evaluation. There patient is deemed to be at chronic elevated risk for self-harm/suicide given the following factors: unwillingness to seek help, poor adherence to treatment, and recent onset of serious medical condition. These risk factors are mitigated by the following factors: no known access to weapons or firearms, no history of previous suicide attempts, presence of an available support system, and safe housing. The patient is deemed to be at chronic elevated risk for violence given the following factors: N/A. These risk factors are mitigated by the following factors: N/A. There is no acute risk for suicide or violence at this time. The patient was educated about relevant modifiable risk factors including following recommendations for treatment of psychiatric illness and abstaining from substance abuse.  While future psychiatric events cannot be accurately predicted, the patient does not currently require  acute inpatient psychiatric care and does not currently meet [place]  involuntary commitment criteria.  Patient was given contact information for crisis resources, behavioral health clinic and was instructed to call 911 for emergencies.    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  Unclear if current neurologist is general or immuno neurologist.  If they only focus on immuno neurology than recommend connecting with the general neurologist to address headaches. - Referral was placed today.   History of Present Illness:  Patient presents joined today by her mother who was involved in the interview with the patient's permission.  Of note patient had moderate speech latency, mostly nonverbal, flat affect, limited insight and was minimally engaged during the course of the interview.  Most of history was obtained from records and patient's mother.  Patient presents today following referral from primary care provider.  Notably patient had been hospitalized from April 20, 2023 until August 13, 2023.  Prior to this patient had been on presentation to behavioral health urgent care on April 10, 2023 at which time hospitalization was recommended however patient declined and was felt she had not met criteria for inpatient admission.  She was discharged with Zyprexa  and outpatient follow-up.  Patient then presented on 10/29 2024 at which point she was transferred to the emergency room due to severe signs of malnourishment.  From there was briefly stabilized and admitted to [facility] on IVC.  Patient then went back and forth between the medical and behavioral health sides of the hospital due to concern of catatonia, psychosis, and malnutrition with refusal to eat.  Patient was placed on IV with NG tube that was eventually removed as she began to slowly increase p.o. intake.  Per mother's report patient's lowest weight was 89 pounds during this time.  [facility] was a brief lorazepam  trial for catatonia and she was also receiving antipsychotic medication.  Catatonic symptoms were not improving and patient was transferred to [facility] on May 25, 2023.  Patient spent 2  months there before discharging on February 21.  While there she had 15 rounds of ECT (last on 2/3), was on a regimen of Ativan  18 mg daily which was gradually tapered to 4 mg daily, and then later started on Zyprexa  due to concern of hallucinations and hyper paucity.  While hospitalized CT of the abdomen was performed and large ovarian mass was revealed.  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  Patient underwent laparoscopic salpingo-oophorectomy on December 11.  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  Patient also received 5 plasmapheresis treatments at the last on December 20.  Per discharge summary patient was verbal, able to discuss her interests in college, able to recount some basic facts about her illness and expressed understanding at time of discharge.  She still did have occasional speech latency, restricted affect, significant memory gaps from her treatment, and limited insight into the significance of her treatment.  There were no overt psychotic symptoms though she would occasionally make paranoid statements.  Patient was discharged on Ativan  1 mg in the morning, at midday, and 2 mg at bedtime.  As well as Zyprexa  10 mg.  Per patient's mother she continued Ativan  until she ran out of the prescription.  As for the Zyprexa  patient had never started due to reported insurance issue.  Patient has not taken any medications since the beginning of April.  Patient has followed up with neurology since her discharge from the hospital but is not connected with psychiatry until today.  Per neurology  evaluation on 14 April there was not evidence of inflammatory calls or catatonia and thus they did not recommend further immunosuppression.  They did recommend a repeat workup for autoimmune encephalitis if symptoms were to recur as well as a follow-up in 6 months.  During assessment  today patient was minimally engaged.  She reported that since her hospital discharge she has been recovering physically and mentally.  She sleeps 15+ hours a day, reportedly going to bed around 9 or 10 PM and then waking up in the afternoon the next day.  Once waking up she will watch TV until she is tired and falls back asleep.  Patient does not feel like she is sleeping excessively though does agree that she is not at her baseline from a year ago.  She does not feel like there is anything wrong with her current presentation though is resistant to taking medications.  On exploration of why patient reports adverse side effects noting that she has had a constant headache since she discharged from the hospital.  She also felt like the medicines made it hard for her to sleep.  We reviewed both the Ativan  and Zyprexa  explained how they tend to increase sedation and how they would not be contributing to a headache currently when one medication was stopped in February and the other at the beginning of April.  The only time patient was able to provide an answer longer than a few words with when asked to describe the TV show she was watching.  During that period she was able to talk for around a minute to describe the plot but was unable to recall the name of the show or names of any of the characters.  The remainder of the history was obtained from patient's mother.  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  The patient's father passed away 2 years ago and she wonders if this contributes to her decline.  Patient's mother reports that she first noticed symptoms in her daughter around May 2024.  At that time her daughter expressed that somebody is watching her and took a picture of her but her mother was unable to see this person.  There was another incident a month later where her daughter wanted to leave  a place suddenly shopping for dress and was unable to explain why.  Towards the end of school Felisa had been reporting that people were bullying her because she smelled bad.  I reports the whole class that this and patient ended up missing a week of school due to this.  Her mother met with the guidance counselor who did not have any knowledge of this.  During summer break there were some hot behaviors such as the patient lowering the blinds anytime her mother raised them.  Upon starting college the patient only attended for around 3 weeks before withdrawing.  Her mother reported that there were symptoms of decreased movement, lack of energy, patient not wanting to eat, and hallucinations.  She had connected with a therapist around that time who endorsed similar observations in addition to patient endorsing persecutory delusions.  Since hospital discharge patient's mother has noticed some improvement in [place].  The intake is still decreased but she is eating more compared to the past.  Her weight was 123 during evaluation today.  This is a decrease from the 136 pounds she weighed 2 months ago.  Patient's mother confirms that her daughter is isolating and sleeping most of the day.  She also describes her daughter as more agitated and rude to her compared to the past.  Given present symptoms we expressed concerns to the patient and her mother of ongoing symptoms of psychosis as well as catatonia.  Patient has extreme hesitancy with medications which seems to be to the degree of paranoia.  Furthermore she was rigid, had inhibited movement, blank staring, and moderate mutism during exam raising concern for catatonia.  While symptoms are not expected to be due to encephalitis per neurology it was suggested to reconnect to repeat evaluation as well as to discuss her chronic headaches that have been ongoing for the past 4 months per patient.  ECT is associated with headache it would not be expected for these to  be present still 4-1/2 months after her last treatment.  Strong recommendation was made for patient to restart on the Ativan  and Zyprexa .  As mentioned she expressed extreme hesitancy but ultimately did agree to start her on Ativan  1 mg 3 times daily and Zyprexa  5 mg at bedtime.  Notably patient's mother is working for a large portion of the day so will not be able to confirm whether patient is taking her medication at midday.  Did recommend her mother give the morning and evening doses of Ativan  as well as the evening dose of Zyprexa  while she is present.  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 versus developing psychotic disorder Psychiatric hospitalizations: 2 Hospitlaizations [facility], was transferred to [facility] from [place] where patient received 15 rounds of ECT Past suicide attempts: Denies Hx of self harm: Denies though patient did have significant intake restriction to the point where she dropped down to 89 pounds Hx of violence towards others: Denies Prior psychiatric providers: Denies Prior therapy: Denies Access to firearms: Denies  Prior medication trials: Ativan  and Zyprexa   Substance use: Denies any substance use  Past Medical History: No past medical history on file. No past surgical history on file.  Family Psychiatric History: Denies any prior family psychiatric history  Family History: No family history on file.  Social History:   Social History   Socioeconomic History   Marital status: Single    Spouse name: Not on file   Number of children: Not on file   Years of education: Not on file   Highest education level: Not on file  Occupational History   Not on file  Tobacco Use   Smoking status: Never   Smokeless tobacco: Never  Substance and Sexual Activity   Alcohol use: No    Drug use: No   Sexual activity: Not on file  Other Topics Concern   Not on file  Social History Narrative   Not on file   Social Drivers of Health   Financial Resource Strain: Not on File (12/12/2021)   Received from [employer]    Financial Resource Strain: 0  Food Insecurity: Low Risk  (05/10/2023)   Received from [facility]   [facility] Food Security    Within the past 12 months, the food you bought just didn't last and you didn't have money to get more.: 3    Within the past 12 months, you worried that your food would run out before you got money to buy more.: 3  Transportation Needs:  Not At Risk (05/10/2023)   Received from [facility]   [facility] Transportation Needs    In the past 12 months, has lack of reliable transportation kept you from medical appointments, meetings, work or from getting things needed for daily living?: No  Physical Activity: Unknown (05/10/2023)   Received from [facility]   Physical Activity    On average, how many days per week do you engage in moderate to strenuous exercise (like a brisk walk)?: 0 days    Physical Activity - Minutes Per Session Most Recent Result: Not on file    Minutes of Exercise per Session: Not on file    On average, how many days per week do you engage in moderate to strenuous exercise (like a brisk walk)?: 0 days    Days of Exercise per Week PEA: Not on file    Minutes of Exercise per Session PEA: Not on file  Stress: Not on File (12/12/2021)   Received from [facility]   Stress    Stress: 0  Social Connections: Not on File (03/05/2023)   Received from [facility]   Social Connections    Connectedness: 0    Additional Social History: 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  She started college at [employer] in the fall 2025.  She completed roughly 3 weeks before withdrawing from her classes.  She lives with her mother and brother.  Her father passed away 2 years ago.  Allergies:  No Known  Allergies  Metabolic Disorder Labs: No results found for: HGBA1C, MPG No results found for: PROLACTIN No results found for: CHOL, TRIG, HDL, CHOLHDL, VLDL, LDLCALC Lab Results  Component Value Date   TSH 1.082 04/10/2023    Therapeutic Level Labs: No results found for: LITHIUM No results found for: CBMZ No results found for: VALPROATE  Current Medications: Current Outpatient Medications  Medication Sig Dispense Refill   Cholecalciferol (VITAMIN D3) 10 MCG (400 UNIT) tablet Take 1 tablet by mouth daily.     LORazepam  (ATIVAN ) 1 MG tablet Take 1 tablet (1 mg total) by mouth in the morning, at noon, and at bedtime. 90 tablet 0   OLANZapine  (ZYPREXA ) 5 MG tablet Take 1 tablet (5 mg total) by mouth at bedtime. 30 tablet 2   melatonin 3 MG TABS tablet Take 3 mg by mouth at bedtime as needed.     No current facility-administered medications for this visit.    Musculoskeletal: Strength & Muscle Tone: spastic Gait & Station: normal Patient leans: N/A  Psychiatric Specialty Exam:  Psychiatric Specialty Exam: Height 5' 3.5 (1.613 m), weight 123 lb (55.8 kg).Body mass index is 21.45 kg/m. Review of Systems  General Appearance: Fairly Groomed  Eye Contact:  Minimal  Speech:  Delayed speech latency could last up to 45 seconds, answers often short or with near imperceptible head movement  Volume:  Normal  Mood:  Patient reports euthymic  Affect:  Flat and withdrawn  Thought Content: Illogical and vague with concern of paranoid ideation   Suicidal Thoughts:  No  Homicidal Thoughts:  No  Thought Process:  Coherent  Orientation:  Full (Time, Place, and Person)    Memory: Immediate;   Poor Recent;   Poor Remote;   Fair  Judgment:  Impaired  Insight:  Lacking  Concentration:  Concentration: Poor  Recall:  not formally assessed   Fund of Knowledge: Poor  Language: Fair  Psychomotor Activity:  Decreased  Akathisia:  No  AIMS (if indicated): not done  Assets:  Housing  ADL's:  Intact  Cognition: WNL  Sleep:  Poor    Screenings: PHQ2-9    Flowsheet Row ED from 04/20/2023 in [facility]  PHQ-2 Total Score 6  PHQ-9 Total Score 21   Flowsheet Row ED from 04/20/2023 in [facility] ED from 04/10/2023 in [facility]  C-SSRS RISK CATEGORY No Risk No Risk     Collaboration of Care: Medication Management AEB medication prescription, Primary Care Provider AEB chart review, Psychiatrist AEB chart review, and Other provider involved in patient's care AEB neurology, ED, hospitalist chart review  Patient/Guardian was advised Release of Information must be obtained prior to any record release in order to collaborate their care with an outside provider. Patient/Guardian was advised if they have not already done so to contact the registration department to sign all necessary forms in order for us  to release information regarding their care.   Consent: Patient/Guardian gives verbal consent for treatment and assignment of benefits for services provided during this visit. Patient/Guardian expressed understanding and agreed to proceed.   Yves Herb, MD 6/17/20255:10 PM

## 2023-12-07 ENCOUNTER — Other Ambulatory Visit: Payer: Self-pay

## 2023-12-07 ENCOUNTER — Ambulatory Visit (HOSPITAL_BASED_OUTPATIENT_CLINIC_OR_DEPARTMENT_OTHER): Payer: MEDICAID | Admitting: Psychiatry

## 2023-12-07 ENCOUNTER — Encounter (HOSPITAL_COMMUNITY): Payer: Self-pay | Admitting: Psychiatry

## 2023-12-07 VITALS — Ht 63.5 in | Wt 123.0 lb

## 2023-12-07 DIAGNOSIS — F29 Unspecified psychosis not due to a substance or known physiological condition: Secondary | ICD-10-CM

## 2023-12-07 MED ORDER — OLANZAPINE 5 MG PO TABS: 5.0000 mg | ORAL_TABLET | Freq: Every day | ORAL | 2 refills | Status: DC

## 2023-12-07 MED ORDER — LORAZEPAM 1 MG PO TABS: 1.0000 mg | ORAL_TABLET | Freq: Three times a day (TID) | ORAL | 0 refills | Status: DC

## 2023-12-07 NOTE — Patient Instructions (Signed)
 Take the Ativan  1 mg in the morning at 6:50 AM, at midday when you wake up, and before bed.  Also take the Olanzapine  5 mg before bed with the Ativan .

## 2023-12-08 ENCOUNTER — Encounter (HOSPITAL_COMMUNITY): Payer: Self-pay | Admitting: Psychiatry

## 2024-01-03 ENCOUNTER — Ambulatory Visit (HOSPITAL_BASED_OUTPATIENT_CLINIC_OR_DEPARTMENT_OTHER): Payer: MEDICAID | Admitting: Psychiatry

## 2024-01-03 ENCOUNTER — Encounter (HOSPITAL_COMMUNITY): Payer: Self-pay | Admitting: Psychiatry

## 2024-01-03 ENCOUNTER — Other Ambulatory Visit: Payer: Self-pay

## 2024-01-03 DIAGNOSIS — F29 Unspecified psychosis not due to a substance or known physiological condition: Secondary | ICD-10-CM | POA: Diagnosis not present

## 2024-01-03 MED ORDER — LORAZEPAM 1 MG PO TABS: 1.0000 mg | ORAL_TABLET | Freq: Three times a day (TID) | ORAL | 0 refills | Status: DC

## 2024-01-03 NOTE — Progress Notes (Signed)
 BH MD/PA/NP OP Progress Note  01/03/2024 4:07 PM Terri Rich  MRN:  [id number]  Visit Diagnosis:    ICD-10-CM   1. Unspecified psychosis not due to a substance or known physiological condition (HCC)  F29 LORazepam  (ATIVAN ) 1 MG tablet      Assessment: Terri Rich is a 18 y.o. female with a history of unspecified psychotic disorder, catatonia, anorexia, ovarian teratoma, and suspicion of anti-NMDA receptor encephalitis who presents in person to [facility] at [facility] for initial evaluation on 12/07/2023.  She had been hospitalized for 2-1/67-month period in December 2024 during which she received 15 rounds of ECT.  At initial evaluation patient presented with moderate speech latency, mostly nonverbal, flat affect, limited insight and was minimally engaged.  She had moderate mutism, inhibited movements, posturing, rigidity, and staring during evaluation.  Thought content was vague and patient seemed to express paranoid ideation particularly in relation to medications.  Based off chart review patient had been more vocal and engaged at time of discharge though there was still some concern about paranoia.  She had been discharged on medications but has not taken the olanzapine  since discharge and stopped the Ativan  at the beginning of April.  Weight is currently stable at 123 pounds though patient has lost roughly 13 pounds since neurology visit in April.  In addition patient had suspicion of anti-NMDA encephalitis which may have triggered her catatonic episode several months ago.  Given limited history and patient's apparent decline since discharge hospitalization it would be appropriate to restart on psychiatric medications today.  Patient has extreme hesitancy bordering on paranoia with the medications but did ultimately agree to restart the Ativan  at 1 mg 3 times daily and Zyprexa  5 mg at bedtime.  We would recommend follow up in a month as well as  reconnecting with neurology for evaluation.    Terri Rich presents for follow-up evaluation. Today, 01/03/24, patient has shown some mild improvements including a decrease in total sleep down to 8 hours a night, some improvement in speech latency and mutism, mild improvement in rigidity and posturing, as well as some increase in mood lability.  She has remained stable with her appetite and intake having gained 5 pounds in the past month.  She has done well in keeping up with her ADLs and self-care.  With the improvement noted patient does still present as predominantly flat during interview with decreased engagement compared to her reported baseline.  The paranoia particularly around medications still does persist.  She was agreeable to continuing on the current regimen though declined the recommended titration of Zyprexa  to 10 mg.  Patient was open to the plan to titrate Zyprexa  at her next visit.  Patient will follow up in 3 weeks.  Psychotherapeutic interventions were used during today's session. From 2:25 PM to 2:45 PM. Therapeutic interventions included empathic listening, supportive therapy, cognitive and behavioral therapy, motivational interviewing. Used supportive interviewing techniques to provide emotional validation. Worked on cognitive reframing techniques and unhelpful thoughts challenged as appropriate. Alternative thoughts developed with guidance. Reviewed some techniques to facilitate increased behavioral activation. Improvement was evidenced by patient's participation and identified commitment to therapy goals.    Risk Assessment: An assessment of suicide and violence risk factors was performed as part of this evaluation and is not significantly changed from the last visit. While future psychiatric events cannot be accurately predicted, the patient does not currently require acute inpatient psychiatric care and does not currently meet [facility]  involuntary commitment  criteria. Patient was given contact information for crisis resources, behavioral health clinic and was instructed to call 911 for emergencies.   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  Unclear if current neurologist is general or immuno neurologist.  If they only focus on immuno neurology than recommend connecting with the general neurologist to address headaches. - Referral was placed today.   Chief Complaint:  Chief Complaint  Patient presents with   Follow-up   HPI: Terri Rich presents alongside her mother who was present with patient's permission. Terri Rich was more engaged during the interview today.  Her answers were still short with limited elaboration on details.  However the hesitation prior to answering and improved.  Patient also appeared to be less guarded compared to the past.  Body posture was still stiff/rigid with limited movement.  She could still have periods of staring into space but was bit more alert and reactive.  Patient's affect also improved slightly.  While it was primarily flat throughout the session she was able to smile and chuckle slightly upon discussing the weather.  Per patient's report she is well. Terri Rich had reported that things have been about the same over the past month.  That said since starting the medication, which she does endorse taking consistently, she has found that she is sleeping less.  Currently she is sleeping 8 to 9 hours a day compared to  the 16 to 18 hours she had been previously.  Patient has also engaged in chores around the house as well as some sketching over the past month.  Outside of that she spent most of her time watching TV.  She does endorse some degree of motivation of restarting school though does not plan to look into this until the spring of next year.  Patient denies much activity outside of these things.  In regards to adverse medication side effects she denies any concerns other than the onset of intermittent palpitations.  Per patient these occur for 1 to 2 minutes roughly once a week.  She experiences a sensation of her heart beating in her chest and it resolves on its own after a minute.  Patient reported that this happened while she was in the hospital as well.  Discussed the effect and recommended continuing to monitor as well as getting a repeat EKG.  Patient has an upcoming PCP appointment and was encouraged to get an EKG at that time.  Spoke with patient's mother for collateral.  She agreed that there has been some improvement in patient over the past month.  That said she has not returned to her baseline from a year ago or 2 the level she had been when she first discharged from the hospital.  We reviewed how there has been declined since patient discharged particularly as she had been off medications for several months.  Said it would take time for her to recompensate after restarting medicines.  Regarding treatment moving forward we recommended titration of Zyprexa  to 10 mg daily.  Patient however declined to take this.  Given concern that patient was  stopped medication entirely we agreed to continue on her current regimen with plan to titrate Zyprexa  at next appointment in 3 weeks.  Patient agreed with this.  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 versus  developing psychotic disorder Psychiatric hospitalizations: 2 Hospitlaizations one in [facility] in November 2025, was transferred to [facility] from [place] where patient received 15 rounds of ECT Past suicide attempts: Denies Hx of self harm: Denies though patient did have significant intake restriction to the point where she dropped down to 89 pounds Hx of violence towards others: Denies Prior psychiatric providers: Denies Prior therapy: Denies Access to firearms: Denies  Prior medication trials: Ativan  and Zyprexa   Substance use: Denies any substance use  Past Medical History: History reviewed. No pertinent past medical history. History reviewed. No pertinent surgical history.  Family History: History reviewed. No pertinent family history.  Social History:  Social History   Socioeconomic History   Marital status: Single    Spouse name: Not on file   Number of children: Not on file   Years of education: Not on file   Highest education level: Not on file  Occupational History   Not on file  Tobacco Use   Smoking status: Never   Smokeless tobacco: Never  Substance and Sexual Activity   Alcohol use: No   Drug use: No   Sexual activity: Not on file  Other Topics Concern   Not on file  Social History Narrative   Not on file   Social Drivers of Health   Financial Resource Strain: Not on File (12/12/2021)   Received from [employer]    Financial Resource Strain: 0  Food Insecurity: Low Risk  (05/10/2023)   Received from [facility]   [facility] Food Security    Within the past 12 months, the food you bought just didn't last and you didn't have money to get more.: 3    Within the past 12 months, you worried that your food would run out before you got money to buy more.: 3  Transportation Needs: Not At Risk (05/10/2023)   Received from [facility]   [facility] Transportation Needs    In the past 12 months, has lack of reliable transportation kept you  from medical appointments, meetings, work or from getting things needed for daily living?: No  Physical Activity: Unknown (05/10/2023)   Received from [facility]   Physical Activity    On average, how many days per week do you engage in moderate to strenuous exercise (like a brisk walk)?: 0 days    Physical Activity - Minutes Per Session Most Recent Result: Not on file    Minutes of Exercise per Session: Not on file    On average, how many days per week do you engage in moderate to strenuous exercise (like a brisk walk)?: 0 days    Days of Exercise per Week PEA: Not on file    Minutes of Exercise per Session PEA: Not on file  Stress: Not on File (12/12/2021)   Received from [facility]   Stress    Stress: 0  Social Connections: Not on File (03/05/2023)   Received from [facility]   Social Connections    Connectedness: 0    Allergies: No Known Allergies  Current Medications: Current Outpatient Medications  Medication Sig Dispense Refill   Cholecalciferol (VITAMIN D3) 10 MCG (400 UNIT) tablet Take 1 tablet by  mouth daily.     melatonin 3 MG TABS tablet Take 3 mg by mouth at bedtime as needed.     OLANZapine  (ZYPREXA ) 5 MG tablet Take 1 tablet (5 mg total) by mouth at bedtime. 30 tablet 2   LORazepam  (ATIVAN ) 1 MG tablet Take 1 tablet (1 mg total) by mouth in the morning, at noon, and at bedtime. 90 tablet 0   No current facility-administered medications for this visit.     Musculoskeletal: Strength & Muscle Tone: within normal limits Gait & Station: normal Patient leans: N/A  Psychiatric Specialty Exam: Blood pressure 98/68, pulse (!) 108, height 5' 3 (1.6 m), weight 128 lb (58.1 kg).Body mass index is 22.67 kg/m. Review of Systems  General Appearance: Well Groomed  Eye Contact:  Fair  Speech:  Clear and Coherent and improvement in speech latency however still has pauses before answering questions and would answer in short 1-2 word responses  Volume:  Decreased  Mood:  Euthymic   Affect:  Predominantly flat with a 1 episode of smiling  Thought Content: Paranoid Ideation   Suicidal Thoughts:  No  Homicidal Thoughts:  No  Thought Process:  Coherent  Orientation:  Full (Time, Place, and Person)    Memory: Immediate;   Fair  Judgment:  Impaired  Insight:  Lacking  Concentration:  Concentration: Fair  Recall:  not formally assessed   Fund of Knowledge: Fair  Language: Fair  Psychomotor Activity:  Decreased  Akathisia:  No  AIMS (if indicated): not done  Assets:  Housing  ADL's:  Intact  Cognition: WNL  Sleep:  Good   Metabolic Disorder Labs: No results found for: HGBA1C, MPG No results found for: PROLACTIN No results found for: CHOL, TRIG, HDL, CHOLHDL, VLDL, LDLCALC Lab Results  Component Value Date   TSH 1.082 04/10/2023    Therapeutic Level Labs: No results found for: LITHIUM No results found for: VALPROATE No results found for: CBMZ   Screenings: PHQ2-9    Flowsheet Row ED from [facility]/20/2023 in [facility]  PHQ-2 Total Score 6  PHQ-9 Total Score 21   Flowsheet Row ED from 04/20/2023 in [facility] ED from 04/10/2023 in [facility]  C-SSRS RISK CATEGORY No Risk No Risk    Collaboration of Care: Collaboration of Care: Medication Management AEB medication prescription  Patient/Guardian was advised Release of Information must be obtained prior to any record release in order to collaborate their care with an outside provider. Patient/Guardian was advised if they have not already done so to contact the registration department to sign all necessary forms in order for us  to release information regarding their care.   Consent: Patient/Guardian gives verbal consent for treatment and assignment of benefits for services provided during this visit. Patient/Guardian expressed understanding and agreed to proceed.    Terri CHRISTELLA Finder,  MD 01/03/2024, 4:07 PM

## 2024-01-05 ENCOUNTER — Ambulatory Visit: Payer: Self-pay | Admitting: General Surgery

## 2024-01-24 NOTE — Progress Notes (Unsigned)
 BH MD/PA/NP OP Progress Note  01/26/2024 4:33 PM ALIZEE MAPLE  MRN:  [id number]  Visit Diagnosis:    ICD-10-CM   1. Unspecified psychosis not due to a substance or known physiological condition (HCC)  F29 OLANZapine  (ZYPREXA ) 10 MG tablet    LORazepam  (ATIVAN ) 1 MG tablet    2. Ovarian mass  N83.8 Ambulatory referral to Obstetrics / Gynecology     Assessment: TENA LINEBAUGH is a 18 y.o. female with a history of unspecified psychotic disorder, catatonia, anorexia, ovarian teratoma, and suspicion of anti-NMDA receptor encephalitis who presents in person to [facility] for initial evaluation on 12/07/2023.  She had been hospitalized for 2-1/82-month period in December 2024 during which she received 15 rounds of ECT.  At initial evaluation patient presented with moderate speech latency, mostly nonverbal, flat affect, limited insight and was minimally engaged.  She had moderate mutism, inhibited movements, posturing, rigidity, and staring during evaluation.  Thought content was vague and patient seemed to express paranoid ideation particularly in relation to medications.  Based off chart review patient had been more vocal and engaged at time of discharge though there was still some concern about paranoia.  She had been discharged on medications but has not taken the olanzapine  since discharge and stopped the Ativan  at the beginning of April.  Weight is currently stable at 123 pounds though patient has lost roughly 13 pounds since neurology visit in April.  In addition patient had suspicion of anti-NMDA encephalitis which may have triggered her catatonic episode several months ago.  Given limited history and patient's apparent decline since discharge hospitalization it would be appropriate to restart on psychiatric medications today.  Patient has extreme hesitancy bordering on paranoia with the medications but did ultimately agree to restart the  Ativan  at 1 mg 3 times daily and Zyprexa  5 mg at bedtime.  We would recommend follow up in a month as well as reconnecting with neurology for evaluation.    Sophiana V Lavallie presents for follow-up evaluation. Today, 01/26/24, patient remained stable compared to last visit.  While more engaged last active compared to initial evaluation she still has concerns for paranoia and obsessions with guarding and difficulty ruling out hallucinations due to the guarded nature.  She did not appear to be responding to internal stimuli on exam however there was a 10 to 15 second speech latency upon answering any question.  Appetite remains improved with patient getting another 5 pounds and sleep is stable.  There is some concern for depressive symptoms bordering on catatonia still.  She does not meet criteria for catatonia however and given patient's concern for psychosis as well as resistance to medications we agreed to taper the Ativan  to twice daily dosing while titrating the Zyprexa  to 10 mg daily.  She could benefit from antidepressant medication as well however she declined today.  Of note patient has not connected with an OB following hospital discharge and was referred to 1 today after being unable to get an appointment at [facility].  Similarly her mother was encouraged to reach out about the reported psychiatry appointment at [facility] in October as there is not any visible appointments in the EMR.  Psychotherapeutic interventions were used during today's session. From 3:35 PM to 4:28 PM. Therapeutic interventions included empathic listening, supportive therapy, cognitive and behavioral therapy, motivational interviewing. Used supportive interviewing techniques to provide emotional validation primarily with patient's mother who accompanied patient to the session. Worked on cognitive reframing techniques and  increasing behavioral activation with the patient. Improvement was evidenced by patient's participation and  identified commitment to therapy goals.    Risk Assessment: An assessment of suicide and violence risk factors was performed as part of this evaluation and is not significantly changed from the last visit. While future psychiatric events cannot be accurately predicted, the patient does not currently require acute inpatient psychiatric care and does not currently meet [facility]  involuntary commitment criteria. Patient was given contact information for crisis resources, behavioral health clinic and was instructed to call 911 for emergencies.   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  Unclear if current neurologist is general or immuno neurologist.  If they only focus on immuno neurology than recommend connecting with the general neurologist to address headaches. - Referral was placed today.   Chief Complaint:  Chief Complaint  Patient presents with   Follow-up   HPI: Mava presents alongside her mother who was present with patient's permission. Anishka reports that the last 3 weeks have gone about the same.  Her sleep is rather unchanged at around 8 to 9 hours a day compared to the previous 16 to 18 hours at initial presentation.  She is still spending her days awake and watching TV or playing games on her phone.  She has not left the house outside of doctor's appointments in the last 3 weeks.  During interview she was able to answer questions with short 2-3 word responses.   There was frequently a 10 to 15-second speech latency before responding.  Patient asked an unprovoked question twice both in relation to her medication or potential side effects.  Body posture was still stiff however there was no rigidity on exam.  Affect was flat throughout the interview.  Patient reports that she continues to take her medication consistently.  She does endorse somatic concerns including intermittent headaches, 2 episodes of palpitations, and a sharp pain around the area of her right ovary.  The headaches have occurred roughly 4 times in the interim with 3 of the events being well at home and lasting roughly 1 minute before resolving.  Headaches present as a pressure on the top and back of her head.  She has used an ice pack with benefit alleviate this.  She also reports headache today which has lasted longer and presents similarly to the previous ones.  She has not had the opportunity to use an ice pack due to coming to the doctor's appointment.  Patient denies any aura, light sensitivity, stabbing pain, or headache shifting towards the right or left sides.  On review she believes that the headaches started a little while prior to restarting on her psychiatric medications.  The palpitations similarly occur for roughly a minute before resolving.  Per patient they occur spontaneously and are not related to stress events.  During the palpitation she can feel her heart beating which is uncomfortable.  She denies any other related symptoms and his self resolves after a minute.  Recommended patient discuss this with her PCP if it continues occurring.  As for the abdominal pain around the surgery site this could be related to her salpingo oophorectomy.  Patient does report there is a scar on procedure right above where the pain was.  Recommended patient connect with an OB/GYN referral was placed.  Notably she had been referred to [facility] for OB however was informed that they are not taking outside  referrals.  Per chart review there was a similar occurrence for her psychiatry referral to [facility].  Patient's mother confirms patient's report that she has not had significant change over the past few weeks.  She does report that patient continues to struggle with extreme aversion for germs.  Patient would not directly touch any door or cabinet handles without some type of barrier between.  She would also have significant issue with her mother touching any of her belongings without having washed her hands first.  On observation of leaving the clinic today we did noticed the patient would walk up to the door but would stand there until her mother opened it.  Then patient thrust her foot into the door to push it further open carefully avoiding the closing door with the rest of her body.  There is still concern for patients guarded behavior, paranoia, and obsessional focus on cleanliness.  While she does not appear to be internally preoccupied this cannot be ruled out given patient's guarded nature.  Furthermore there is concern for depression and some symptoms of catatonia that not enough to meet criteria.  Discussed and recommended medications to which patient still has significant resistance.  She ultimately agreed to increase the Zyprexa  to 10 mg daily with the Ativan  decreasing to twice daily dosing once in the morning and once in the evening.  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 versus developing psychotic disorder Psychiatric hospitalizations: 2 Hospitlaizations one in [facility] in November 2025, was transferred to [facility] from [place] where patient received 15 rounds of ECT Past suicide attempts: Denies Hx of self harm: Denies though patient did have significant intake restriction to the point where she dropped down to 89 pounds Hx of  violence towards others: Denies Prior psychiatric providers: Denies Prior therapy: Denies Access to firearms: Denies  Prior medication trials: Ativan  and Zyprexa   Substance use: Denies any substance use  Past Medical History: History reviewed. No pertinent past medical history. History reviewed. No pertinent surgical history.  Family History: History reviewed. No pertinent family history.  Social History:  Social History   Socioeconomic History   Marital status: Single    Spouse name: Not on file   Number of children: Not on file   Years of education: Not on file   Highest education level: Not on file  Occupational History   Not on file  Tobacco Use   Smoking status: Never   Smokeless tobacco: Never  Substance and Sexual Activity   Alcohol use: No   Drug use: No   Sexual activity: Not on file  Other Topics Concern   Not on file  Social History Narrative   Not on file   Social Drivers of Health   Financial Resource Strain: Not on File (12/12/2021)   Received from [employer]    Financial Resource Strain: 0  Food Insecurity: Low Risk  (05/10/2023)   Received from [facility]   [facility] Food Security    Within the past 12 months, the food you  bought just didn't last and you didn't have money to get more.: 3    Within the past 12 months, you worried that your food would run out before you got money to buy more.: 3  Transportation Needs: Not At Risk (05/10/2023)   Received from [facility]   [facility] Transportation Needs    In the past 12 months, has lack of reliable transportation kept you from medical appointments, meetings, work or from getting things needed for daily living?: No  Physical Activity: Unknown (05/10/2023)   Received from [facility]   Physical Activity    On average, how many days per week do you engage in moderate to strenuous exercise (like a brisk walk)?: 0 days    Physical Activity - Minutes Per Session Most Recent Result: Not on  file    Minutes of Exercise per Session: Not on file    On average, how many days per week do you engage in moderate to strenuous exercise (like a brisk walk)?: 0 days    Days of Exercise per Week PEA: Not on file    Minutes of Exercise per Session PEA: Not on file  Stress: Not on File (12/12/2021)   Received from [facility]   Stress    Stress: 0  Social Connections: Not on File (03/05/2023)   Received from [facility]   Social Connections    Connectedness: 0    Allergies: No Known Allergies  Current Medications: Current Outpatient Medications  Medication Sig Dispense Refill   Cholecalciferol (VITAMIN D3) 10 MCG (400 UNIT) tablet Take 1 tablet by mouth daily.     LORazepam  (ATIVAN ) 1 MG tablet Take 1 tablet (1 mg total) by mouth in the morning, at noon, and at bedtime. 90 tablet 0   melatonin 3 MG TABS tablet Take 3 mg by mouth at bedtime as needed.     OLANZapine  (ZYPREXA ) 5 MG tablet Take 1 tablet (5 mg total) by mouth at bedtime. 30 tablet 2   No current facility-administered medications for this visit.     Musculoskeletal: Strength & Muscle Tone: within normal limits Gait & Station: normal Patient leans: N/A  Psychiatric Specialty Exam: Blood pressure 113/72, pulse 97, height 5' 3 (1.6 m), weight 133 lb 12.8 oz (60.7 kg).Body mass index is 23.7 kg/m. Review of Systems  General Appearance: Well Groomed  Eye Contact:  Fair  Speech:  Clear and Coherent and improvement in speech latency however still has pauses before answering questions and would answer in short 1-2 word responses  Volume:  Decreased  Mood:  Euthymic  Affect:  Predominantly flat with a 1 episode of smiling  Thought Content: Paranoid Ideation   Suicidal Thoughts:  No  Homicidal Thoughts:  No  Thought Process:  Coherent  Orientation:  Full (Time, Place, and Person)    Memory: Immediate;   Fair  Judgment:  Impaired  Insight:  Lacking  Concentration:  Concentration: Fair  Recall:  not formally assessed   Fund  of Knowledge: Fair  Language: Fair  Psychomotor Activity:  Decreased  Akathisia:  No  AIMS (if indicated): not done  Assets:  Housing  ADL's:  Intact  Cognition: WNL  Sleep:  Good   Metabolic Disorder Labs: No results found for: HGBA1C, MPG No results found for: PROLACTIN No results found for: CHOL, TRIG, HDL, CHOLHDL, VLDL, LDLCALC Lab Results  Component Value Date   TSH 1.082 04/10/2023    Therapeutic Level Labs: No results found for: LITHIUM No results found for: VALPROATE No results found  for: CBMZ   Screenings: PHQ2-9    Flowsheet Row ED from 04/20/2023 in [facility]  PHQ-2 Total Score 6  PHQ-9 Total Score 21   Flowsheet Row ED from 04/20/2023 in [facility] ED from 04/10/2023 in [facility]  C-SSRS RISK CATEGORY No Risk No Risk    Collaboration of Care: Collaboration of Care: Medication Management AEB medication prescription and Other provider involved in patient's care AEB PCP and neurology chart review  Patient/Guardian was advised Release of Information must be obtained prior to any record release in order to collaborate their care with an outside provider. Patient/Guardian was advised if they have not already done so to contact the registration department to sign all necessary forms in order for us  to release information regarding their care.   Consent: Patient/Guardian gives verbal consent for treatment and assignment of benefits for services provided during this visit. Patient/Guardian expressed understanding and agreed to proceed.    Arvella CHRISTELLA Finder, MD 01/26/2024, 4:33 PM

## 2024-01-26 ENCOUNTER — Encounter (HOSPITAL_COMMUNITY): Payer: Self-pay | Admitting: Psychiatry

## 2024-01-26 ENCOUNTER — Ambulatory Visit (HOSPITAL_BASED_OUTPATIENT_CLINIC_OR_DEPARTMENT_OTHER): Payer: MEDICAID | Admitting: Psychiatry

## 2024-01-26 VITALS — BP 113/72 | HR 97 | Ht 63.0 in | Wt 133.8 lb

## 2024-01-26 DIAGNOSIS — F29 Unspecified psychosis not due to a substance or known physiological condition: Secondary | ICD-10-CM | POA: Diagnosis not present

## 2024-01-26 DIAGNOSIS — N838 Other noninflammatory disorders of ovary, fallopian tube and broad ligament: Secondary | ICD-10-CM | POA: Diagnosis not present

## 2024-01-26 MED ORDER — LORAZEPAM 1 MG PO TABS: 1.0000 mg | ORAL_TABLET | Freq: Two times a day (BID) | ORAL | 0 refills | Status: DC

## 2024-01-26 MED ORDER — OLANZAPINE 10 MG PO TABS: 10.0000 mg | ORAL_TABLET | Freq: Every day | ORAL | 2 refills | Status: DC

## 2024-01-31 ENCOUNTER — Encounter (HOSPITAL_COMMUNITY): Payer: Self-pay | Admitting: General Surgery

## 2024-02-01 ENCOUNTER — Other Ambulatory Visit: Payer: Self-pay

## 2024-02-01 ENCOUNTER — Encounter (HOSPITAL_COMMUNITY): Payer: Self-pay | Admitting: General Surgery

## 2024-02-01 NOTE — Progress Notes (Signed)
 SDW call  Patient's mother, Regino was given pre-op instructions over the phone. She verbalized understanding of instructions provided. She denies any SOB, fever or cough     PCP - Dr. Starleen Monte   Chest x-ray - na EKG -  04/23/2023 Stress Test - ECHO -  Cardiac Cath -   Sleep Study/sleep apnea/CPAP: denies  Non-diabetic  Blood Thinner Instructions: denies Aspirin Instructions:denies   ERAS Protcol - Clears unil 0730   Anesthesia review: No   Your procedure is scheduled on Wednesday February 02, 2024  Report to [facility] Main Entrance A at  0800  A.M., then check in with the Admitting office.  Call this number if you have problems the morning of surgery:  [phone]   If you have any questions prior to your surgery date call [phone]: Open Monday-Friday 8am-4pm If you experience any cold or flu symptoms such as cough, fever, chills, shortness of breath, etc. between now and your scheduled surgery, please notify us  at the above number    Remember:  Do not eat after midnight the night before your surgery  You may drink clear liquids until   0730  the morning of your surgery.   Clear liquids allowed are: Water, Non-Citrus Juices (without pulp), Carbonated Beverages, Clear Tea, Black Coffee ONLY (NO MILK, CREAM OR POWDERED CREAMER of any kind), and Gatorade   Take these medicines the morning of surgery with A SIP OF WATER:  Ativan   As of today, STOP taking any Aspirin (unless otherwise instructed by your surgeon) Aleve, Naproxen, Ibuprofen , Motrin , Advil , Goody's, BC's, all herbal medications, fish oil, and all vitamins.

## 2024-02-01 NOTE — Anesthesia Preprocedure Evaluation (Addendum)
 Anesthesia Evaluation  Patient identified by MRN, date of birth, ID band Patient awake    Reviewed: Allergy & Precautions, NPO status , Patient's Chart, lab work & pertinent test results  History of Anesthesia Complications Negative for: history of anesthetic complications  Airway Mallampati: III  TM Distance: >3 FB Neck ROM: Full    Dental  (+) Dental Advisory Given Unable to get a good look at teeth due to patient effort. Patient denies anything loose/removable.:   Pulmonary neg pulmonary ROS   Pulmonary exam normal breath sounds clear to auscultation       Cardiovascular negative cardio ROS  Rhythm:Regular Rate:Normal     Neuro/Psych  PSYCHIATRIC DISORDERS (catatonia) Anxiety   Schizophrenia  negative neurological ROS     GI/Hepatic negative GI ROS, Neg liver ROS,,,  Endo/Other  negative endocrine ROS    Renal/GU negative Renal ROS     Musculoskeletal   Abdominal   Peds  Hematology negative hematology ROS (+)   Anesthesia Other Findings Lipoma of back  Reproductive/Obstetrics Ovarian teratoma                              Anesthesia Physical Anesthesia Plan  ASA: 2  Anesthesia Plan: General   Post-op Pain Management: Tylenol  PO (pre-op)*   Induction: Intravenous  PONV Risk Score and Plan: 3 and Ondansetron , Dexamethasone , Midazolam  and Treatment may vary due to age or medical condition  Airway Management Planned: Oral ETT  Additional Equipment:   Intra-op Plan:   Post-operative Plan: Extubation in OR  Informed Consent: I have reviewed the patients History and Physical, chart, labs and discussed the procedure including the risks, benefits and alternatives for the proposed anesthesia with the patient or authorized representative who has indicated his/her understanding and acceptance.     Dental advisory given  Plan Discussed with: CRNA and  Anesthesiologist  Anesthesia Plan Comments: (Risks of general anesthesia discussed including, but not limited to, sore throat, hoarse voice, chipped/damaged teeth, injury to vocal cords, nausea and vomiting, allergic reactions, lung infection, heart attack, stroke, and death. All questions answered. )         Anesthesia Quick Evaluation

## 2024-02-02 ENCOUNTER — Encounter (HOSPITAL_COMMUNITY): Payer: Self-pay | Admitting: General Surgery

## 2024-02-02 ENCOUNTER — Encounter (HOSPITAL_COMMUNITY)
Admission: RE | Disposition: A | Discharge status: Home / Self Care | Payer: Self-pay | Source: Home / Self Care | Attending: General Surgery

## 2024-02-02 ENCOUNTER — Encounter (HOSPITAL_COMMUNITY): Payer: MEDICAID | Admitting: Anesthesiology

## 2024-02-02 ENCOUNTER — Ambulatory Visit (HOSPITAL_COMMUNITY)
Admission: RE | Admit: 2024-02-02 | Discharge: 2024-02-02 | Disposition: A | Discharge status: Home / Self Care | Payer: MEDICAID | Attending: General Surgery | Admitting: General Surgery

## 2024-02-02 ENCOUNTER — Ambulatory Visit (HOSPITAL_COMMUNITY): Payer: MEDICAID | Admitting: Anesthesiology

## 2024-02-02 ENCOUNTER — Other Ambulatory Visit: Payer: Self-pay

## 2024-02-02 DIAGNOSIS — D171 Benign lipomatous neoplasm of skin and subcutaneous tissue of trunk: Secondary | ICD-10-CM | POA: Insufficient documentation

## 2024-02-02 DIAGNOSIS — F419 Anxiety disorder, unspecified: Secondary | ICD-10-CM | POA: Insufficient documentation

## 2024-02-02 HISTORY — DX: Anxiety disorder, unspecified: F41.9

## 2024-02-02 HISTORY — DX: Schizophrenia, unspecified: F20.9

## 2024-02-02 HISTORY — PX: EXCISION MASS, BACK: SHX7560

## 2024-02-02 HISTORY — DX: Catatonic disorder due to known physiological condition: F06.1

## 2024-02-02 LAB — CBC
HCT: 38.1 % (ref 36.0–46.0)
Hemoglobin: 12.2 g/dL (ref 12.0–15.0)
MCH: 27.4 pg (ref 26.0–34.0)
MCHC: 32 g/dL (ref 30.0–36.0)
MCV: 85.6 fL (ref 80.0–100.0)
Platelets: 247 K/uL (ref 150–400)
RBC: 4.45 MIL/uL (ref 3.87–5.11)
RDW: 14.7 % (ref 11.5–15.5)
WBC: 7.8 K/uL (ref 4.0–10.5)
nRBC: 0 % (ref 0.0–0.2)

## 2024-02-02 LAB — POCT PREGNANCY, URINE: Preg Test, Ur: NEGATIVE

## 2024-02-02 SURGERY — EXCISION MASS, BACK
Anesthesia: General | Site: Back

## 2024-02-02 MED ORDER — OXYCODONE HCL 5 MG PO TABS: 5.0000 mg | ORAL_TABLET | Freq: Three times a day (TID) | ORAL | 0 refills | Status: AC | PRN

## 2024-02-02 MED ORDER — CHLORHEXIDINE GLUCONATE 0.12 % MT SOLN
OROMUCOSAL | Status: AC
  Administered 2024-02-02 (×2): 15 mL via OROMUCOSAL
  Filled 2024-02-02: qty 15

## 2024-02-02 MED ORDER — MIDAZOLAM HCL 2 MG/2ML IJ SOLN
INTRAMUSCULAR | Status: DC | PRN
  Administered 2024-02-02 (×2): 2 mg via INTRAVENOUS

## 2024-02-02 MED ORDER — FENTANYL CITRATE (PF) 250 MCG/5ML IJ SOLN
INTRAMUSCULAR | Status: DC | PRN
  Administered 2024-02-02 (×2): 100 ug via INTRAVENOUS

## 2024-02-02 MED ORDER — ACETAMINOPHEN 500 MG PO TABS
1000.0000 mg | ORAL_TABLET | ORAL | Status: AC
  Administered 2024-02-02 (×2): 1000 mg via ORAL
  Filled 2024-02-02: qty 2

## 2024-02-02 MED ORDER — FENTANYL CITRATE (PF) 100 MCG/2ML IJ SOLN: 25.0000 ug | INTRAMUSCULAR | Status: DC | PRN

## 2024-02-02 MED ORDER — PROPOFOL 10 MG/ML IV BOLUS
INTRAVENOUS | Status: AC
  Filled 2024-02-02: qty 20

## 2024-02-02 MED ORDER — CHLORHEXIDINE GLUCONATE 0.12 % MT SOLN: 15.0000 mL | Freq: Once | OROMUCOSAL | Status: AC

## 2024-02-02 MED ORDER — PHENYLEPHRINE 80 MCG/ML (10ML) SYRINGE FOR IV PUSH (FOR BLOOD PRESSURE SUPPORT)
PREFILLED_SYRINGE | INTRAVENOUS | Status: DC | PRN
  Administered 2024-02-02 (×6): 160 ug via INTRAVENOUS

## 2024-02-02 MED ORDER — PROPOFOL 10 MG/ML IV BOLUS
INTRAVENOUS | Status: DC | PRN
  Administered 2024-02-02 (×2): 200 mg via INTRAVENOUS

## 2024-02-02 MED ORDER — ACETAMINOPHEN 325 MG PO TABS: 650.0000 mg | ORAL_TABLET | Freq: Four times a day (QID) | ORAL | 0 refills | Status: AC

## 2024-02-02 MED ORDER — CHLORHEXIDINE GLUCONATE CLOTH 2 % EX PADS: 6.0000 | MEDICATED_PAD | Freq: Once | CUTANEOUS | Status: DC

## 2024-02-02 MED ORDER — ROCURONIUM BROMIDE 10 MG/ML (PF) SYRINGE
PREFILLED_SYRINGE | INTRAVENOUS | Status: DC | PRN
  Administered 2024-02-02 (×2): 50 mg via INTRAVENOUS

## 2024-02-02 MED ORDER — DEXMEDETOMIDINE HCL IN NACL 80 MCG/20ML IV SOLN
INTRAVENOUS | Status: DC | PRN
  Administered 2024-02-02 (×2): 8 ug via INTRAVENOUS

## 2024-02-02 MED ORDER — GABAPENTIN 300 MG PO CAPS
300.0000 mg | ORAL_CAPSULE | ORAL | Status: AC
  Administered 2024-02-02 (×2): 300 mg via ORAL
  Filled 2024-02-02: qty 1

## 2024-02-02 MED ORDER — CELECOXIB 200 MG PO CAPS
200.0000 mg | ORAL_CAPSULE | ORAL | Status: AC
  Administered 2024-02-02 (×2): 200 mg via ORAL
  Filled 2024-02-02: qty 1

## 2024-02-02 MED ORDER — CEFAZOLIN SODIUM-DEXTROSE 2-4 GM/100ML-% IV SOLN
2.0000 g | INTRAVENOUS | Status: AC
  Administered 2024-02-02 (×2): 2 g via INTRAVENOUS
  Filled 2024-02-02: qty 100

## 2024-02-02 MED ORDER — OXYCODONE HCL 5 MG/5ML PO SOLN: 5.0000 mg | Freq: Once | ORAL | Status: DC | PRN

## 2024-02-02 MED ORDER — DEXAMETHASONE SODIUM PHOSPHATE 10 MG/ML IJ SOLN
INTRAMUSCULAR | Status: DC | PRN
  Administered 2024-02-02 (×2): 10 mg via INTRAVENOUS

## 2024-02-02 MED ORDER — MIDAZOLAM HCL 2 MG/2ML IJ SOLN
INTRAMUSCULAR | Status: AC
  Filled 2024-02-02: qty 2

## 2024-02-02 MED ORDER — SUGAMMADEX SODIUM 200 MG/2ML IV SOLN
INTRAVENOUS | Status: DC | PRN
  Administered 2024-02-02 (×2): 200 mg via INTRAVENOUS

## 2024-02-02 MED ORDER — BUPIVACAINE-EPINEPHRINE (PF) 0.25% -1:200000 IJ SOLN
INTRAMUSCULAR | Status: AC
  Filled 2024-02-02: qty 30

## 2024-02-02 MED ORDER — 0.9 % SODIUM CHLORIDE (POUR BTL) OPTIME
TOPICAL | Status: DC | PRN
  Administered 2024-02-02 (×2): 1000 mL

## 2024-02-02 MED ORDER — ORAL CARE MOUTH RINSE: 15.0000 mL | Freq: Once | OROMUCOSAL | Status: AC

## 2024-02-02 MED ORDER — OXYCODONE HCL 5 MG PO TABS: 5.0000 mg | ORAL_TABLET | Freq: Once | ORAL | Status: DC | PRN

## 2024-02-02 MED ORDER — LACTATED RINGERS IV SOLN: INTRAVENOUS | Status: DC | PRN

## 2024-02-02 MED ORDER — BUPIVACAINE-EPINEPHRINE 0.25% -1:200000 IJ SOLN
INTRAMUSCULAR | Status: DC | PRN
  Administered 2024-02-02 (×2): 30 mL

## 2024-02-02 MED ORDER — FENTANYL CITRATE (PF) 250 MCG/5ML IJ SOLN
INTRAMUSCULAR | Status: AC
Start: 2024-02-02 — End: 2024-02-02
  Filled 2024-02-02: qty 5

## 2024-02-02 MED ORDER — IBUPROFEN 200 MG PO TABS
600.0000 mg | ORAL_TABLET | Freq: Four times a day (QID) | ORAL | 0 refills | Status: AC
Start: 2024-02-02 — End: 2024-02-08

## 2024-02-02 MED ORDER — AMISULPRIDE (ANTIEMETIC) 5 MG/2ML IV SOLN: 10.0000 mg | Freq: Once | INTRAVENOUS | Status: DC | PRN

## 2024-02-02 MED ORDER — LACTATED RINGERS IV SOLN: INTRAVENOUS | Status: DC

## 2024-02-02 MED ORDER — LIDOCAINE 2% (20 MG/ML) 5 ML SYRINGE
INTRAMUSCULAR | Status: DC | PRN
  Administered 2024-02-02 (×2): 60 mg via INTRAVENOUS

## 2024-02-02 MED ORDER — ONDANSETRON HCL 4 MG/2ML IJ SOLN
INTRAMUSCULAR | Status: DC | PRN
  Administered 2024-02-02 (×2): 4 mg via INTRAVENOUS

## 2024-02-02 SURGICAL SUPPLY — 34 items
BAG COUNTER SPONGE SURGICOUNT (BAG) ×1 IMPLANT
BENZOIN TINCTURE PRP APPL 2/3 (GAUZE/BANDAGES/DRESSINGS) ×1 IMPLANT
BLADE CLIPPER SURG (BLADE) IMPLANT
CANISTER SUCTION 3000ML PPV (SUCTIONS) IMPLANT
CHLORAPREP W/TINT 26 (MISCELLANEOUS) ×1 IMPLANT
COVER SURGICAL LIGHT HANDLE (MISCELLANEOUS) ×1 IMPLANT
DERMABOND ADVANCED .7 DNX12 (GAUZE/BANDAGES/DRESSINGS) IMPLANT
DRAPE LAPAROTOMY 100X72 PEDS (DRAPES) IMPLANT
DRAPE SURG ORHT 6 SPLT 77X108 (DRAPES) IMPLANT
ELECTRODE REM PT RTRN 9FT ADLT (ELECTROSURGICAL) ×1 IMPLANT
GAUZE SPONGE 4X4 12PLY STRL (GAUZE/BANDAGES/DRESSINGS) ×1 IMPLANT
GLOVE BIO SURGEON STRL SZ7 (GLOVE) IMPLANT
GLOVE BIO SURGEON STRL SZ7.5 (GLOVE) ×2 IMPLANT
GLOVE BIOGEL PI IND STRL 8 (GLOVE) ×1 IMPLANT
GOWN STRL REUS W/ TWL LRG LVL3 (GOWN DISPOSABLE) ×1 IMPLANT
GOWN STRL REUS W/ TWL XL LVL3 (GOWN DISPOSABLE) ×1 IMPLANT
KIT BASIN OR (CUSTOM PROCEDURE TRAY) ×1 IMPLANT
KIT TURNOVER KIT B (KITS) ×1 IMPLANT
MARKER SKIN DUAL TIP RULER LAB (MISCELLANEOUS) IMPLANT
NDL HYPO 25GX1X1/2 BEV (NEEDLE) ×1 IMPLANT
NEEDLE HYPO 25GX1X1/2 BEV (NEEDLE) ×1 IMPLANT
NS IRRIG 1000ML POUR BTL (IV SOLUTION) ×1 IMPLANT
PACK GENERAL/GYN (CUSTOM PROCEDURE TRAY) ×1 IMPLANT
PAD ARMBOARD POSITIONER FOAM (MISCELLANEOUS) ×1 IMPLANT
PENCIL SMOKE EVACUATOR (MISCELLANEOUS) ×1 IMPLANT
SPECIMEN JAR SMALL (MISCELLANEOUS) ×1 IMPLANT
STRIP CLOSURE SKIN 1/2X4 (GAUZE/BANDAGES/DRESSINGS) ×1 IMPLANT
SUT MNCRL AB 4-0 PS2 18 (SUTURE) ×1 IMPLANT
SUT VIC AB 2-0 SH 18 (SUTURE) IMPLANT
SUT VIC AB 3-0 SH 18 (SUTURE) ×1 IMPLANT
SYR CONTROL 10ML LL (SYRINGE) ×1 IMPLANT
TOWEL GREEN STERILE (TOWEL DISPOSABLE) ×1 IMPLANT
TOWEL GREEN STERILE FF (TOWEL DISPOSABLE) ×1 IMPLANT
UNDERPAD 30X36 HEAVY ABSORB (UNDERPADS AND DIAPERS) IMPLANT

## 2024-02-02 NOTE — Anesthesia Procedure Notes (Signed)
 Procedure Name: Intubation Date/Time: 02/02/2024 10:25 AM  Performed by: Scherrie Mast, CRNAPre-anesthesia Checklist: Patient identified, Emergency Drugs available, Suction available and Patient being monitored Patient Re-evaluated:Patient Re-evaluated prior to induction Oxygen Delivery Method: Circle System Utilized Preoxygenation: Pre-oxygenation with 100% oxygen Induction Type: IV induction Ventilation: Mask ventilation without difficulty Laryngoscope Size: Mac and 3 Grade View: Grade I Tube type: Oral Tube size: 7.0 mm Number of attempts: 1 Airway Equipment and Method: Stylet and Oral airway Placement Confirmation: ETT inserted through vocal cords under direct vision, positive ETCO2 and breath sounds checked- equal and bilateral Secured at: 22 cm Tube secured with: Tape Dental Injury: Teeth and Oropharynx as per pre-operative assessment

## 2024-02-02 NOTE — Progress Notes (Signed)
 Pt first stated that sh had a whole bottle pf water by 0830 am; then said it was a half of the bottle. Dr. Peggye is aware.

## 2024-02-02 NOTE — Op Note (Signed)
 02/02/2024  11:34 AM  PATIENT:  Terri Rich  18 y.o. female  Patient Care Team: Perri Starleen BROCKS, MD as PCP - General (Nephrology)  PRE-OPERATIVE DIAGNOSIS:  Back mass  POST-OPERATIVE DIAGNOSIS:  Back lipoma (13cm x 8cm x 3cm)  PROCEDURE:  Excision of back lipoma (13cm x 8cm x 3cm)  SURGEON:  Cordella RONAL Idler, MD  ASSISTANT: None  ANESTHESIA:   general  COUNTS:  Sponge, needle and instrument counts were reported correct x2 at the conclusion of the operation.  EBL: Minimal  DRAINS: None  SPECIMEN: Back mass  COMPLICATIONS: None  FINDINGS: Lipomatous back mass  DISPOSITION: PACU in satisfactory condition  INDICATION: [name] is a 18yo female with a symptomatic back mass. I offered to excise it in the OR. Her questions were addressed and written consent was obtained.  DESCRIPTION: The patient was identified in preop holding and taken to the OR where she was placed on the operating room table. SCDs were placed. General endotracheal anesthesia was induced without difficulty. She was placed prone and her back was then prepped and draped in the usual sterile fashion. A surgical timeout was performed indicating the correct patient, procedure, positioning and need for preoperative antibiotics.  The previously marked area near the left upper back was identified. A incision was made overlying the mass using a #15 blade and carried down through the subcutaneous tissue using electrocautery. The mass was encountered and appeared lipomatous. The mass was freed from surrounding tissues using blunt dissection and electrocautery, excised, and then passed off the field to be sent as a specimen. The mass measured 13cm x 8cm x 3cm. The wound was irrigated with sterile saline and inspected for hemostasis. The deep layers were closed with interrupted 2-0 vicryl. Interrupted 3-0 vicryl were used to approximate the dermis followed by a running 4-0 subcuticular monocryl. A field block  was performed using 0.25% marcaine  with epinephrine . A layer of dermabond was applied.

## 2024-02-02 NOTE — H&P (Signed)
     Terri Rich [birth year]  [id number].    HPI:  18 y/o F who presents for elective excision of a soft tissue mass on her back. She reports that she is in her usual state of health and denies any recent changes in medication.   ROS: Review of Systems  Constitutional: Negative.   HENT: Negative.    Eyes: Negative.   Respiratory: Negative.    Cardiovascular: Negative.   Gastrointestinal: Negative.   Genitourinary: Negative.   Musculoskeletal: Negative.   Skin:        Soft tissue back mass  Neurological: Negative.   Endo/Heme/Allergies: Negative.   Psychiatric/Behavioral: Negative.      History reviewed. No pertinent family history.  Past Medical History:  Diagnosis Date   Anxiety    Catatonia    Schizophrenia [facility])     Past Surgical History:  Procedure Laterality Date   OOPHORECTOMY Right     Social History:  reports that she has never smoked. She has never been exposed to tobacco smoke. She has never used smokeless tobacco. She reports that she does not drink alcohol and does not use drugs.  Allergies:  Allergies  Allergen Reactions   Pollen Extract Other (See Comments)    Headaches    Medications Prior to Admission  Medication Sig Dispense Refill   Cholecalciferol (VITAMIN D3) 10 MCG (400 UNIT) tablet Take 400 Units by mouth daily.     folic acid (FOLVITE) 400 MCG tablet Take 400 mcg by mouth daily.     LORazepam  (ATIVAN ) 1 MG tablet Take 1 tablet (1 mg total) by mouth 2 (two) times daily. 60 tablet 0   OLANZapine  (ZYPREXA ) 10 MG tablet Take 1 tablet (10 mg total) by mouth at bedtime. 30 tablet 2   melatonin 3 MG TABS tablet Take 3 mg by mouth at bedtime as needed. (Patient not taking: Reported on 01/28/2024)      Physical Exam: Height 5' 3 (1.6 m), weight 60.3 kg, last menstrual period 01/18/2024. Gen: female, NAD Back: soft tissue mass marked  No results found for this or any previous visit (from the past 48 hours). No results  found.  Assessment/Plan 18 y/o F w/ a symptomatic soft tissue mass on her back  - Will proceed to the OR. We discussed the alternatives and potential risks of surgery, including but not limited to: bleeding, infection, recurrence, and wound complications. All questions were addressed and consent was obtained.    Cordella DELENA Polly Marlis [facility] Surgery 02/02/2024, 8:36 AM Please see Amion for pager number during day hours 7:00am-4:30pm or 7:00am -11:30am on weekends

## 2024-02-02 NOTE — Transfer of Care (Signed)
 Immediate Anesthesia Transfer of Care Note  Patient: Terri Rich  Procedure(s) Performed: EXCISION OF BACK MASS (Back)  Patient Location: PACU  Anesthesia Type:General  Level of Consciousness: drowsy  Airway & Oxygen Therapy: Patient Spontanous Breathing  Post-op Assessment: Report given to RN and Post -op Vital signs reviewed and stable  Post vital signs: Reviewed and stable  Last Vitals:  Vitals Value Taken Time  BP 109/51 02/02/24 11:42  Temp 36.4 C 02/02/24 11:42  Pulse 90 02/02/24 11:44  Resp 15 02/02/24 11:44  SpO2 96 % 02/02/24 11:44  Vitals shown include unfiled device data.  Last Pain:  Vitals:   02/02/24 0903  TempSrc: Oral  PainSc:          Complications: No notable events documented.

## 2024-02-02 NOTE — Anesthesia Postprocedure Evaluation (Signed)
 Anesthesia Post Note  Patient: Terri Rich  Procedure(s) Performed: EXCISION OF BACK MASS (Back)     Patient location during evaluation: PACU Anesthesia Type: General Level of consciousness: awake Pain management: pain level controlled Vital Signs Assessment: post-procedure vital signs reviewed and stable Respiratory status: spontaneous breathing, nonlabored ventilation and respiratory function stable Cardiovascular status: blood pressure returned to baseline and stable Postop Assessment: no apparent nausea or vomiting Anesthetic complications: no   No notable events documented.  Last Vitals:  Vitals:   02/02/24 1215 02/02/24 1230  BP: 109/63 113/60  Pulse: 100 86  Resp: 12 11  Temp: (!) 36.2 C   SpO2: 97% 98%    Last Pain:  Vitals:   02/02/24 1215  TempSrc:   PainSc: 0-No pain                 Delon Aisha Arch

## 2024-02-03 ENCOUNTER — Encounter (HOSPITAL_COMMUNITY): Payer: Self-pay | Admitting: General Surgery

## 2024-02-03 LAB — SURGICAL PATHOLOGY

## 2024-02-08 ENCOUNTER — Ambulatory Visit (HOSPITAL_COMMUNITY): Admitting: Psychiatry

## 2024-02-19 ENCOUNTER — Other Ambulatory Visit (HOSPITAL_COMMUNITY): Payer: Self-pay | Admitting: Psychiatry

## 2024-02-19 DIAGNOSIS — F29 Unspecified psychosis not due to a substance or known physiological condition: Secondary | ICD-10-CM

## 2024-03-06 NOTE — Progress Notes (Unsigned)
 BH MD/PA/NP OP Progress Note  03/07/2024 4:41 PM Terri Rich  MRN:  [id number]  Visit Diagnosis:    ICD-10-CM   1. Unspecified psychosis not due to a substance or known physiological condition (HCC)  F29 LORazepam  (ATIVAN ) 1 MG tablet    OLANZapine  (ZYPREXA ) 10 MG tablet    2. Ovarian mass  N83.8       Assessment: Terri Rich is a 18 y.o. female with a history of unspecified psychotic disorder, catatonia, anorexia, ovarian teratoma, and suspicion of anti-NMDA receptor encephalitis who presents in person to [facility] at [facility] for initial evaluation on 12/07/2023.  She had been hospitalized for 2-1/34-month period in December 2024 during which she received 15 rounds of ECT.  At initial evaluation patient presented with moderate speech latency, mostly nonverbal, flat affect, limited insight and was minimally engaged.  She had moderate mutism, inhibited movements, posturing, rigidity, and staring during evaluation.  Thought content was vague and patient seemed to express paranoid ideation particularly in relation to medications.  Based off chart review patient had been more vocal and engaged at time of discharge though there was still some concern about paranoia.  She had been discharged on medications but has not taken the olanzapine  since discharge and stopped the Ativan  at the beginning of April.  Weight is currently stable at 123 pounds though patient has lost roughly 13 pounds since neurology visit in April.  In addition patient had suspicion of anti-NMDA encephalitis which may have triggered her catatonic episode several months ago.  Given limited history and patient's apparent decline since discharge hospitalization it would be appropriate to restart on psychiatric medications today.  Patient has extreme hesitancy bordering on paranoia with the medications but did ultimately agree to restart the Ativan  at 1 mg 3 times daily and Zyprexa  5 mg  at bedtime.  We would recommend follow up in a month as well as reconnecting with neurology for evaluation.    Terri Rich presents for follow-up evaluation. Today, 03/07/24, patient    remained stable compared to last visit.  While more engaged last active compared to initial evaluation she still has concerns for paranoia and obsessions with guarding and difficulty ruling out hallucinations due to the guarded nature.  She did not appear to be responding to internal stimuli on exam however there was a 10 to 15 second speech latency upon answering any question.  Appetite remains improved with patient getting another 5 pounds and sleep is stable.  There is some concern for depressive symptoms bordering on catatonia still.  She does not meet criteria for catatonia however and given patient's concern for psychosis as well as resistance to medications we agreed to taper the Ativan  to twice daily dosing while titrating the Zyprexa  to 10 mg daily.  She could benefit from antidepressant medication as well however she declined today.  Of note patient has not connected with an OB following hospital discharge and was referred to 1 today after being unable to get an appointment at [facility].  Similarly her mother was encouraged to reach out about the reported psychiatry appointment at [facility] in October as there is not any visible appointments in the EMR.  Psychotherapeutic interventions were used during today's session. From 3:35 PM to 4:28 PM. Therapeutic interventions included empathic listening, supportive therapy, cognitive and behavioral therapy, motivational interviewing. Used supportive interviewing techniques to provide emotional validation primarily with patient's mother who accompanied patient to the session. Worked on cognitive reframing techniques and increasing  behavioral activation with the patient. Improvement was evidenced by patient's participation and identified commitment to therapy goals.     Risk Assessment: An assessment of suicide and violence risk factors was performed as part of this evaluation and is not significantly changed from the last visit. While future psychiatric events cannot be accurately predicted, the patient does not currently require acute inpatient psychiatric care and does not currently meet [place]  involuntary commitment criteria. Patient was given contact information for crisis resources, behavioral health clinic and was instructed to call 911 for emergencies.   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  Unclear if current neurologist is general or immuno neurologist.  If they only focus on immuno neurology than recommend connecting with the general neurologist to address headaches. - Referral was placed today.   Chief Complaint:  No chief complaint on file.  HPI: Terri Rich presents alongside her mother who was present with patient's permission. Terri Rich reports that     Started walking  Fear of germs, touching things in public door handles, pens, buttons railings At home still handles  10/7   Hasn't wanted to touch door handles since around 4 months ago  the last 3 weeks have gone about the same.  Her sleep is rather unchanged at around 8 to 9 hours a day compared to the previous 16 to 18 hours at initial presentation.  She is still spending her days awake and watching TV or playing games on her phone.  She has not left the house  outside of doctor's appointments in the last 3 weeks.  During interview she was able to answer questions with short 2-3 word responses.  There was frequently a 10 to 15-second speech latency before responding.  Patient asked an unprovoked question twice both in relation to her medication or potential side effects.  Body posture was still stiff however there was no rigidity on exam.  Affect was flat throughout the interview.  Patient reports that she continues to take her medication consistently.  She does endorse somatic concerns including intermittent headaches, 2 episodes of palpitations, and a sharp pain around the area of her right ovary.  The headaches have occurred roughly 4 times in the interim with 3 of the events being well at home and lasting roughly 1 minute before resolving.  Headaches present as a pressure on the top and back of her head.  She has used an ice pack with benefit alleviate this.  She also reports headache today which has lasted longer and presents similarly to the previous ones.  She has not had the opportunity to use an ice pack due to coming to the doctor's appointment.  Patient denies any aura, light sensitivity, stabbing pain, or headache shifting towards the right or left sides.  On review she believes that the headaches started a little while prior to restarting on her psychiatric medications.  The palpitations similarly occur for roughly a minute before resolving.  Per patient they occur spontaneously and are not related to stress events.  During the palpitation she can feel  her heart beating which is uncomfortable.  She denies any other related symptoms and his self resolves after a minute.  Recommended patient discuss this with her PCP if it continues occurring.  As for the abdominal pain around the surgery site this could be related to her salpingo oophorectomy.  Patient does report there is a scar on procedure right above where the pain was.  Recommended patient connect with  an OB/GYN referral was placed.  Notably she had been referred to [facility] for OB however was informed that they are not taking outside referrals.  Per chart review there was a similar occurrence for her psychiatry referral to [facility].  Patient's mother confirms patient's report that she has not had significant change over the past few weeks.  She does report that patient continues to struggle with extreme aversion for germs.  Patient would not directly touch any door or cabinet handles without some type of barrier between.  She would also have significant issue with her mother touching any of her belongings without having washed her hands first.  On observation of leaving the clinic today we did noticed the patient would walk up to the door but would stand there until her mother opened it.  Then patient thrust her foot into the door to push it further open carefully avoiding the closing door with the rest of her body.  There is still concern for patients guarded behavior, paranoia, and obsessional focus on cleanliness.  While she does not appear to be internally preoccupied this cannot be ruled out given patient's guarded nature.  Furthermore there is concern for depression and some symptoms of catatonia that not enough to meet criteria.  Discussed and recommended medications to which patient still has significant resistance.  She ultimately agreed to increase the Zyprexa  to 10 mg daily with the Ativan  decreasing to twice daily dosing once in the morning and once in the evening.  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 versus developing psychotic disorder Psychiatric hospitalizations: 2 Hospitlaizations one in [facility] in November 2025, was transferred to [facility] from [place] where patient received 15 rounds of ECT Past suicide attempts: Denies Hx  of self harm: Denies though patient did have significant intake restriction to the point where she dropped down to 89 pounds Hx of violence towards others: Denies Prior psychiatric providers: Denies Prior therapy: Denies Access to firearms: Denies  Prior medication trials: Ativan  and Zyprexa   Substance use: Denies any substance use  Past Medical History:  Past Medical History:  Diagnosis Date   Anxiety    Catatonia    Schizophrenia [facility])     Past Surgical History:  Procedure Laterality Date   EXCISION MASS, BACK N/A 02/02/2024   Procedure: EXCISION OF BACK MASS;  Surgeon: Polly Cordella LABOR, MD;  Location: [facility] OR;  Service: General;  Laterality: N/A;   OOPHORECTOMY Right     Family History: No family history on file.  Social History:  Social History   Socioeconomic History   Marital status: Single    Spouse name: Not on file   Number of children: Not on file   Years of education: Not on file   Highest education level: Not on file  Occupational History   Not on file  Tobacco Use   Smoking status: Never    Passive exposure: Never   Smokeless tobacco: Never  Substance and Sexual Activity   Alcohol  use: No   Drug use: No   Sexual activity: Not on file  Other Topics Concern   Not on file  Social History Narrative   Not on file   Social Drivers of Health   Financial Resource Strain: Not on File (12/12/2021)   Received from [employer]    Financial Resource Strain: 0  Food Insecurity: Low Risk  (05/10/2023)   Received from [facility]   [facility] Food Security    Within the past 12 months, the food you bought just didn't last and you didn't have money to get more.: 3    Within the past 12 months, you worried that your food would run out before you got money to buy more.: 3  Transportation Needs: Not At Risk (05/10/2023)   Received from [facility]   [facility] Transportation Needs    In the past 12 months, has lack of reliable transportation kept you  from medical appointments, meetings, work or from getting things needed for daily living?: No  Physical Activity: Unknown (05/10/2023)   Received from [facility]   Physical Activity    On average, how many days per week do you engage in moderate to strenuous exercise (like a brisk walk)?: 0 days    Physical Activity - Minutes Per Session Most Recent Result: Not on file    Minutes of Exercise per Session: Not on file    On average, how many days per week do you engage in moderate to strenuous exercise (like a brisk walk)?: 0 days    Days of Exercise per Week PEA: Not on file    Minutes of Exercise per Session PEA: Not on file  Stress: Not on File (12/12/2021)   Received from [facility]   Stress    Stress: 0  Social Connections: Not on File (03/05/2023)   Received from [facility]   Social Connections    Connectedness: 0    Allergies:  Allergies  Allergen Reactions   Pollen Extract Other (See Comments)    Headaches    Current Medications: Current Outpatient Medications  Medication Sig Dispense Refill   Cholecalciferol (VITAMIN D3) 10 MCG (400 UNIT) tablet Take 400 Units by mouth daily.     folic acid (FOLVITE) 400 MCG tablet Take 400 mcg by mouth daily.     LORazepam  (ATIVAN ) 1 MG tablet Take 1 tablet (1 mg total) by mouth 2 (two) times daily. 60 tablet 1   melatonin 3 MG TABS tablet Take 3 mg by mouth at bedtime as needed. (Patient not taking: Reported on 03/07/2024)     OLANZapine  (ZYPREXA ) 10 MG tablet Take 1 tablet (10 mg total) by mouth at bedtime. 30 tablet 2   No current facility-administered medications for this visit.     Musculoskeletal: Strength & Muscle Tone: within normal limits Gait & Station: normal Patient leans: N/A  Psychiatric Specialty Exam: Blood pressure 110/73, pulse 94, height 5' 3 (1.6 m), weight 139 lb (63 kg).Body mass index is 24.62 kg/m. Review of Systems  General Appearance: Well Groomed  Eye Contact:  Fair  Speech:  Clear and Coherent and  improvement in speech latency however still has pauses before answering questions and would answer in short 1-2 word responses  Volume:  Decreased  Mood:  Euthymic  Affect:  Predominantly flat with a 1 episode of smiling  Thought Content: Paranoid Ideation   Suicidal Thoughts:  No  Homicidal Thoughts:  No  Thought Process:  Coherent  Orientation:  Full (Time, Place, and  Person)    Memory: Immediate;   Fair  Judgment:  Impaired  Insight:  Lacking  Concentration:  Concentration: Fair  Recall:  not formally assessed   Fund of Knowledge: Fair  Language: Fair  Psychomotor Activity:  Decreased  Akathisia:  No  AIMS (if indicated): not done  Assets:  Housing  ADL's:  Intact  Cognition: WNL  Sleep:  Good   Metabolic Disorder Labs: No results found for: HGBA1C, MPG No results found for: PROLACTIN No results found for: CHOL, TRIG, HDL, CHOLHDL, VLDL, LDLCALC Lab Results  Component Value Date   TSH 1.082 04/10/2023    Therapeutic Level Labs: No results found for: LITHIUM No results found for: VALPROATE No results found for: CBMZ   Screenings: PHQ2-9    Flowsheet Row ED [facility]20/20[facility] in [facility]  PHQ-2 Total Score 6  PHQ-9 Total Score 21   Flowsheet Row Admission (Discharged) from 02/02/2024 in [facility] from [facility]/20/2023 in [facility] ED from 04/10/2023 in [facility]  C-SSRS RISK CATEGORY No Risk No Risk No Risk    Collaboration of Care: Collaboration of Care: Medication Management AEB medication prescription and Other provider involved in patient's care AEB PCP and neurology chart review  Patient/Guardian was advised Release of Information must be obtained prior to any record release in order to collaborate their care with an outside provider. Patient/Guardian was advised if they have not already done so to contact the registration  department to sign all necessary forms in order for us  to release information regarding their care.   Consent: Patient/Guardian gives verbal consent for treatment and assignment of benefits for services provided during this visit. Patient/Guardian expressed understanding and agreed to proceed.    Terri CHRISTELLA Finder, MD 03/07/2024, 4:41 PM

## 2024-03-07 ENCOUNTER — Ambulatory Visit (HOSPITAL_BASED_OUTPATIENT_CLINIC_OR_DEPARTMENT_OTHER): Payer: MEDICAID | Admitting: Psychiatry

## 2024-03-07 ENCOUNTER — Other Ambulatory Visit: Payer: Self-pay

## 2024-03-07 ENCOUNTER — Encounter (HOSPITAL_COMMUNITY): Payer: Self-pay | Admitting: Psychiatry

## 2024-03-07 VITALS — BP 110/73 | HR 94 | Ht 63.0 in | Wt 139.0 lb

## 2024-03-07 DIAGNOSIS — F29 Unspecified psychosis not due to a substance or known physiological condition: Secondary | ICD-10-CM | POA: Diagnosis not present

## 2024-03-07 DIAGNOSIS — N838 Other noninflammatory disorders of ovary, fallopian tube and broad ligament: Secondary | ICD-10-CM

## 2024-03-07 MED ORDER — OLANZAPINE 10 MG PO TABS: 10.0000 mg | ORAL_TABLET | Freq: Every day | ORAL | 2 refills | Status: DC

## 2024-03-07 MED ORDER — LORAZEPAM 1 MG PO TABS: 1.0000 mg | ORAL_TABLET | Freq: Two times a day (BID) | ORAL | 1 refills | Status: DC

## 2024-03-08 ENCOUNTER — Encounter (HOSPITAL_COMMUNITY): Payer: Self-pay | Admitting: Psychiatry

## 2024-05-08 NOTE — Progress Notes (Unsigned)
 BH MD/PA/NP OP Progress Note  05/09/2024 5:39 PM RHILYNN PREYER  MRN:  [id number]  Visit Diagnosis:    ICD-10-CM   1. Unspecified psychosis not due to a substance or known physiological condition (HCC)  F29 LORazepam  (ATIVAN ) 1 MG tablet    OLANZapine  (ZYPREXA ) 5 MG tablet       Assessment: Fred V Schutter is a 18 y.o. female with a history of unspecified psychotic disorder, catatonia, anorexia, ovarian teratoma, and suspicion of anti-NMDA receptor encephalitis who presents in person to [facility] at [facility] for initial evaluation on 12/07/2023.  She had been hospitalized for 2-1/40-month period in December 2024 during which she received 15 rounds of ECT.  At initial evaluation patient presented with moderate speech latency, mostly nonverbal, flat affect, limited insight and was minimally engaged.  She had moderate mutism, inhibited movements, posturing, rigidity, and staring during evaluation.  Thought content was vague and patient seemed to express paranoid ideation particularly in relation to medications.  Based off chart review patient had been more vocal and engaged at time of discharge though there was still some concern about paranoia.  She had been discharged on medications but has not taken the olanzapine  since discharge and stopped the Ativan  at the beginning of April.  Weight is currently stable at 123 pounds though patient has lost roughly 13 pounds since neurology visit in April.  In addition patient had suspicion of anti-NMDA encephalitis which may have triggered her catatonic episode several months ago.  Given limited history and patient's apparent decline since discharge hospitalization it would be appropriate to restart on psychiatric medications today.  Patient has extreme hesitancy bordering on paranoia with the medications but did ultimately agree to restart the Ativan  at 1 mg 3 times daily and Zyprexa  5 mg at bedtime.  We would  recommend follow up in a month as well as reconnecting with neurology for evaluation.    Daryn V Spooner presents for follow-up evaluation. Today, 05/09/24, patient    has remained largely stable in the interim.  There has been a slight increase in behavioral activation.  There are still concerns for paranoia and obsessions.  Specifically addressed concern for cleanliness and avoidance of touching objects touched by other people.  Patient was able to take a pen from this provider however felt the urge to wash her hands afterwards.  Patient is still guarded and can have a 10 to 15-second speech latency when responding to question.  Recommended starting SSRI medication to target obsessions/compulsions however patient declined reporting lack of desire to take more medications.  Patient scheduled follow-up in 2 months and will continue on current regimen.  Of note patient's mother reports upcoming psychiatry appointment at [facility] in 3 weeks.  Recommended discussing plan with that provider to determine who they want to manage psychiatric care moving forward as it would not make sense to see multiple psychiatric providers.  Psychotherapeutic interventions were used during today's session. From 4:05 PM to 4:38 PM. Therapeutic interventions included empathic listening, supportive therapy, cognitive and behavioral therapy, motivational interviewing. Used supportive interviewing techniques to provide emotional validation primarily with patient's mother who accompanied patient to the session. Worked on cognitive reframing techniques and increasing behavioral activation with the patient. Improvement was evidenced by patient's participation and identified commitment to therapy goals.   Risk Assessment: An assessment of suicide and violence risk factors was performed as part of this evaluation and is not significantly changed from the last visit. While future psychiatric events cannot be  accurately  predicted, the patient does not currently require acute inpatient psychiatric care and does not currently meet   involuntary commitment criteria. Patient was given contact information for crisis resources, behavioral health clinic and was instructed to call 911 for emergencies.   Plan: # Unspecified psychosis versus OCD Past medication trials:  Status of problem: Ongoing Interventions: -- Continue Zyprexa  to 10 mg bedtime -Recommended starting Lexapro 5 mg patient declined today, was agreeable at next visit if memory concerns still present.  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  Unclear if current neurologist is general or immuno neurologist.  If they only focus on immuno neurology than recommend connecting with the general neurologist to address headaches. - Referral was placed today.   Chief Complaint:  Chief Complaint  Patient presents with   Follow-up   HPI: Dulcey presents alongside her mother who was present with patient's permission. Leiani reports that   They did meet with the psychiatrist from [facility] in the interim, it is too far away. Since October she has been taking the Olanzapine  5 mg and the Ativan  1 mg BID. She reports that her activity has increased since. Of note this is due to her mother increasing her walking schedule and asking Jasmon to go with her. Ryah does not believe she would have done it on her own.   Over the last month cardio, walking around the neighborhood with her mom on days that the weather permits. For about 20 mintues.   Bit worried about memory returning to school she feel her brain can  still get foggy sometimes. She can have trouble remembering things whether they are from the present or the past.  She is worried about being able to comprehend. She states that the fogginess can occur once a week. When it does occur it can last for a couple of hours.   Discussed school and retunring, she said I am not sure I am ready. Not sure if she can handle it  Worried that she would forget something, fail her exams, and that she would not retain the information.  Patient was more engaged on interview. She    Last ECT in the beginning of February of 2024  S     things have been all right the past month.  She thinks things are gradually improving and reports that she started walking outside of the house daily with her mom and brother.  She will typically walk for 15 to 30 minutes a day.  Outside of this she denies any significant change in her daily routine.  Patient spends most time at home watching TV or playing a game on her phone.  She does not leave the house outside of the walks or doctors appointments.  We discussed patient's concern around touching objects such as pens or door handles today.  She was still hesitant when responding but did for that the symptoms began 4 months ago after they moved to a new place.  Patient has not been touching door handles, cabinet handles, or other things people frequently touch both in or outside of the home since then.  It is gone to the point where she will stand in front of the  door until somebody opens up for her if she is unable to open it without touching it.  Briahnna has also been washing her hands more frequently and showering 2 times a day.  Both of these are influenced on whether she has interacted with something that made her feel dirty.  Patient notes that the primary concern with touching objects is that there are germs that will affect her.  She denies any voices telling her not to touch things or concern about something negative happen  if she did touch something dirty.  In addition to the concern around germs patient reports that cleanliness is an issue and she will frequently clean her room or pick things up off the floor if they are not in an orderly manner.  We asked if symptoms had occurred prior to her hospitalization his mom and previously reported patient having excessive his on cleanliness and germs back during COVID.  Patient however reports being unable to recall stating that memory is still foggy from things that happened prior to her hospitalization  We expressed concern for patient's symptoms and possible OCD and suggested starting on SSRI medication.  Patient declined reporting she not want to take any more medications.  We had discussed the possibility of replacing a medication such as the vitamin D however she wanted to speak with her PCP about this first.  We explained that the vitamin D levels certainly are not a concern however her psychiatric symptoms have been the most debilitating for her currently.  Of note patient's mother reports they have an appointment with [facility] psychiatry in 3 weeks.  Provided education that it is fine to attend the appointment however moving forward would make sense to have only 1 psychiatric provider manage medications and not two.  That being the case they should discuss this with the provider at [facility] and determine where they would like to follow-up.  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 versus developing psychotic disorder Psychiatric hospitalizations: 2 Hospitlaizations one in [facility] in November 2025, was transferred to [facility] from [place] where patient received 15 rounds of ECT Past suicide attempts: Denies Hx of self harm: Denies though patient did have significant intake restriction to the point where she dropped  down to 89 pounds Hx of violence towards others: Denies Prior psychiatric providers: Denies Prior therapy: Denies Access to firearms: Denies  Prior medication trials: Ativan  and Zyprexa   Substance use: Denies any substance use  Past Medical History:  Past Medical History:  Diagnosis Date   Anxiety    Catatonia    Schizophrenia [facility])     Past Surgical History:  Procedure Laterality Date   EXCISION MASS, BACK N/A 02/02/2024   Procedure: EXCISION OF BACK MASS;  Surgeon: Polly Cordella LABOR, MD;  Location: [facility] OR;  Service: General;  Laterality: N/A;   OOPHORECTOMY Right     Family History: No family history on file.  Social History:  Social History   Socioeconomic History   Marital status: Single    Spouse name: Not on file   Number of children: Not on file   Years of education: Not on file   Highest education level: Not on file  Occupational History   Not on file  Tobacco Use   Smoking status: Never    Passive exposure: Never   Smokeless tobacco: Never  Substance and Sexual Activity   Alcohol use: No  Drug use: No   Sexual activity: Not on file  Other Topics Concern   Not on file  Social History Narrative   Not on file   Social Drivers of Health   Financial Resource Strain: Not on File (12/12/2021)   Received from [employer]    Financial Resource Strain: 0  Food Insecurity: Low Risk (05/10/2023)   Received from [facility]   [facility] Food Security    Within the past 12 months, the food you bought just didn't last and you didn't have money to get more.: 3    Within the past 12 months, you worried that your food would run out before you got money to buy more.: 3  Transportation Needs: Not At Risk (05/10/2023)   Received from [facility]   [facility] Transportation Needs    In the past 12 months, has lack of reliable transportation kept you from medical appointments, meetings, work or from getting things needed for daily living?: No  Physical  Activity: Unknown (05/10/2023)   Received from [facility]   Physical Activity    On average, how many days per week do you engage in moderate to strenuous exercise (like a brisk walk)?: 0 days    Physical Activity - Minutes Per Session Most Recent Result: Not on file    Minutes of Exercise per Session: Not on file    On average, how many days per week do you engage in moderate to strenuous exercise (like a brisk walk)?: 0 days    Days of Exercise per Week PEA: Not on file    Minutes of Exercise per Session PEA: Not on file  Stress: Not on File (12/12/2021)   Received from [facility]   Stress    Stress: 0  Social Connections: Not on File (03/05/2023)   Received from [facility]   Social Connections    Connectedness: 0    Allergies:  Allergies  Allergen Reactions   Pollen Extract Other (See Comments)    Headaches    Current Medications: Current Outpatient Medications  Medication Sig Dispense Refill   Cholecalciferol (VITAMIN D3) 10 MCG (400 UNIT) tablet Take 400 Units by mouth daily.     folic acid (FOLVITE) 400 MCG tablet Take 400 mcg by mouth daily.     melatonin 3 MG TABS tablet Take 3 mg by mouth at bedtime as needed.     [START ON 05/16/2024] LORazepam  (ATIVAN ) 1 MG tablet Take 1 tablet (1 mg total) by mouth 2 (two) times daily. 60 tablet 2   OLANZapine  (ZYPREXA ) 5 MG tablet Take 1 tablet (5 mg total) by mouth at bedtime. 30 tablet 2   No current facility-administered medications for this visit.     Musculoskeletal: Strength & Muscle Tone: within normal limits Gait & Station: normal Patient leans: N/A  Psychiatric Specialty Exam: Blood pressure 117/77, pulse 90, height 5' 3 (1.6 m), weight 142 lb (64.4 kg).Body mass index is 25.15 kg/m. Review of Systems  General Appearance: Well Groomed  Eye Contact:  Fair  Speech:  Clear and Coherent and improvement in speech latency however still has pauses before answering questions and would answer in short 1-2 word responses  Volume:   Decreased  Mood:  Euthymic  Affect:  Predominantly flat with a 1 episode of smiling  Thought Content: Paranoid Ideation   Suicidal Thoughts:  No  Homicidal Thoughts:  No  Thought Process:  Coherent  Orientation:  Full (Time, Place, and Person)    Memory: Immediate;  Fair  Judgment:  Impaired  Insight:  Lacking  Concentration:  Concentration: Fair  Recall:  not formally assessed   Fund of Knowledge: Fair  Language: Fair  Psychomotor Activity:  Decreased  Akathisia:  No  AIMS (if indicated): not done  Assets:  Housing  ADL's:  Intact  Cognition: WNL  Sleep:  Good   Metabolic Disorder Labs: No results found for: HGBA1C, MPG No results found for: PROLACTIN No results found for: CHOL, TRIG, HDL, CHOLHDL, VLDL, LDLCALC Lab Results  Component Value Date   TSH 1.082 04/10/2023    Therapeutic Level Labs: No results found for: LITHIUM No results found for: VALPROATE No results found for: CBMZ   Screenings: PHQ2-9    Flowsheet Row ED from 04/20/2023 in [facility]  PHQ-2 Total Score 6  PHQ-9 Total Score 21   Flowsheet Row Admission (Discharged) from 02/02/2024 in [facility] from 04/20/2023 in [facility] ED from 04/10/2023 in [facility]  C-SSRS RISK CATEGORY No Risk No Risk No Risk    Collaboration of Care: Collaboration of Care: Medication Management AEB medication prescription and Other provider involved in patient's care AEB PCP and neurology chart review  Patient/Guardian was advised Release of Information must be obtained prior to any record release in order to collaborate their care with an outside provider. Patient/Guardian was advised if they have not already done so to contact the registration department to sign all necessary forms in order for us  to release information regarding their care.   Consent: Patient/Guardian gives verbal  consent for treatment and assignment of benefits for services provided during this visit. Patient/Guardian expressed understanding and agreed to proceed.    Arvella CHRISTELLA Finder, MD 05/09/2024, 5:39 PM

## 2024-05-09 ENCOUNTER — Ambulatory Visit (HOSPITAL_COMMUNITY): Payer: MEDICAID | Admitting: Psychiatry

## 2024-05-09 ENCOUNTER — Encounter (HOSPITAL_COMMUNITY): Payer: Self-pay | Admitting: Psychiatry

## 2024-05-09 VITALS — BP 117/77 | HR 90 | Ht 63.0 in | Wt 142.0 lb

## 2024-05-09 DIAGNOSIS — F061 Catatonic disorder due to known physiological condition: Secondary | ICD-10-CM

## 2024-05-09 DIAGNOSIS — F29 Unspecified psychosis not due to a substance or known physiological condition: Secondary | ICD-10-CM | POA: Diagnosis not present

## 2024-05-09 MED ORDER — LORAZEPAM 1 MG PO TABS: 1.0000 mg | ORAL_TABLET | Freq: Two times a day (BID) | ORAL | 2 refills | Status: AC

## 2024-05-09 MED ORDER — OLANZAPINE 5 MG PO TABS: 5.0000 mg | ORAL_TABLET | Freq: Every day | ORAL | 2 refills | Status: DC

## 2024-05-10 ENCOUNTER — Encounter (HOSPITAL_COMMUNITY): Payer: Self-pay | Admitting: Psychiatry

## 2024-05-29 ENCOUNTER — Encounter: Payer: MEDICAID | Admitting: Obstetrics and Gynecology

## 2024-05-29 ENCOUNTER — Ambulatory Visit (INDEPENDENT_AMBULATORY_CARE_PROVIDER_SITE_OTHER): Payer: MEDICAID | Admitting: Obstetrics and Gynecology

## 2024-05-29 ENCOUNTER — Other Ambulatory Visit: Payer: Self-pay

## 2024-05-29 ENCOUNTER — Encounter: Payer: Self-pay | Admitting: Obstetrics and Gynecology

## 2024-05-29 VITALS — BP 110/69 | HR 108 | Wt 145.0 lb

## 2024-05-29 DIAGNOSIS — D279 Benign neoplasm of unspecified ovary: Secondary | ICD-10-CM | POA: Diagnosis not present

## 2024-05-29 DIAGNOSIS — G0481 Other encephalitis and encephalomyelitis: Secondary | ICD-10-CM | POA: Diagnosis not present

## 2024-05-29 NOTE — Progress Notes (Unsigned)
   NEW GYNECOLOGY PATIENT Patient name: Terri Rich MRN [id number]  Date of birth: [birth year] Chief Complaint:   Establish Care     History:  Discussed the use of AI scribe software for clinical note transcription with the patient, who gave verbal consent to proceed.  History of Present Illness           Gynecologic History Patient's last menstrual period was 05/07/2024 (within weeks). Contraception: {method:5051}   OB History  Gravida Para Term Preterm AB Living  0 0 0 0 0 0  SAB IAB Ectopic Multiple Live Births  0 0 0 0 0     The following portions of the patient's history were reviewed and updated as appropriate: allergies, current medications, past family history, past medical history, past social history, past surgical history and problem list. Health Maintenance  Topic Date Due  . HPV Vaccine (1 - 3-dose series) Never done  . HIV Screening  Never done  . COVID-19 Vaccine (2 - Pfizer risk series) 02/07/2020  . Meningitis B Vaccine (1 of 2 - Standard) Never done  . Hepatitis C Screening  Never done  . DTaP/Tdap/Td vaccine (1 - Tdap) Never done  . Hepatitis B Vaccine (1 of 3 - 19+ 3-dose series) Never done  . Flu Shot  01/21/2024  . Pneumococcal Vaccine  Completed     Review of Systems Pertinent items noted in HPI and remainder of comprehensive ROS otherwise negative.  Physical Exam:  BP 110/69   Pulse (!) 108   Wt 145 lb (65.8 kg)   LMP 05/07/2024 (Within Weeks)   BMI 25.69 kg/m  Physical Exam Vitals and nursing note reviewed.  Constitutional:      Appearance: Normal appearance.  Cardiovascular:     Rate and Rhythm: Normal rate.  Pulmonary:     Effort: Pulmonary effort is normal.     Breath sounds: Normal breath sounds.  Abdominal:     Comments: soft, + carnett in RLQ, LUQ tenderness, well healed laparoscopic incision in bilateral lower quadrants with ~3 cm curvilinear infraumbilical incision  Neurological:     General: No focal  deficit present.     Mental Status: She is alert and oriented to person, place, and time.  Psychiatric:        Mood and Affect: Mood normal.        Behavior: Behavior normal.        Thought Content: Thought content normal.        Judgment: Judgment normal.        Assessment and Plan:  Assessment and Plan Assessment & Plan        Follow-up: Return in about 6 weeks (around 07/10/2024).      Carter Quarry, MD Obstetrician & Gynecologist, Faculty Practice [facility] [employer], [facility]

## 2024-06-05 ENCOUNTER — Ambulatory Visit (HOSPITAL_COMMUNITY): Admission: RE | Admit: 2024-06-05 | Discharge: 2024-06-05 | Payer: MEDICAID | Attending: Obstetrics and Gynecology

## 2024-06-05 DIAGNOSIS — G0481 Other encephalitis and encephalomyelitis: Secondary | ICD-10-CM | POA: Diagnosis present

## 2024-06-05 DIAGNOSIS — D279 Benign neoplasm of unspecified ovary: Secondary | ICD-10-CM | POA: Diagnosis present

## 2024-06-14 ENCOUNTER — Other Ambulatory Visit (HOSPITAL_COMMUNITY): Payer: Self-pay

## 2024-06-16 ENCOUNTER — Ambulatory Visit: Payer: Self-pay | Admitting: Obstetrics and Gynecology

## 2024-06-16 NOTE — Telephone Encounter (Addendum)
 Attempt to call pt about ultrasound results.  Her mother answered and stated she is at work.  Office number provided, she states she will have the pt call office.   Waddell, RN  ----- Message from Carter Quarry, MD sent at 06/16/2024  8:29 AM EST ----- Notify that US  shows that remaining ovary is normal and that area where other ovary used to be has not abnormalities, or new masses. Recommend over the counter pain meds, apply heat and if  interested, can try trigger points in clinic

## 2024-06-19 NOTE — Telephone Encounter (Signed)
 Advised pt of Ultrasound results per Dr. Jeralyn and provider's recommendations of treating with heat and otc medications.  PT verbalized understanding.  I informed her that if these methods do not help and she's interested in trigger point injections in clinic with Dr. Jeralyn she can reach back out.  She had no further questions at this time.    Waddell, RN

## 2024-07-10 NOTE — Progress Notes (Unsigned)
 BH MD/PA/NP OP Progress Note  07/11/2024 4:54 PM Terri Rich  MRN:  [id number]  Visit Diagnosis:    ICD-10-CM   1. Generalized anxiety disorder  F41.1 escitalopram  (LEXAPRO ) 5 MG tablet    2. 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 female with a history of unspecified psychotic disorder, catatonia, anorexia, ovarian teratoma, and suspicion of anti-NMDA receptor encephalitis who presents in person to [facility] for initial evaluation on 12/07/2023.  She had been hospitalized for 2-1/50-month period in December 2024 during which she received 15 rounds of ECT.  At initial evaluation patient presented with moderate speech latency, mostly nonverbal, flat affect, limited insight and was minimally engaged.  She had moderate mutism, inhibited movements, posturing, rigidity, and staring during evaluation.  Thought content was vague and patient seemed to express paranoid ideation particularly in relation to medications.  Based off chart review patient had been more vocal and engaged at time of discharge though there was still some concern about paranoia.  She had been discharged on medications but has not taken the olanzapine  since discharge and stopped the Ativan  at the beginning of April.  Weight is currently stable at 123 pounds though patient has lost roughly 13 pounds since neurology visit in April.  In addition patient had suspicion of anti-NMDA encephalitis which may have triggered her catatonic episode several months ago.  Given limited history and patient's apparent decline since discharge hospitalization it would be appropriate to restart on psychiatric medications today.  Patient has extreme hesitancy bordering on paranoia with the medications but did ultimately agree to restart the Ativan  at 1 mg 3 times daily and  Zyprexa  5 mg at bedtime.  We would recommend follow up in a month as well as reconnecting with neurology for evaluation.    Terri Rich presents for follow-up evaluation. Today, 07/11/24, patient continues to display steady progress of increased engagement.  Speech latency was intermittently present during session but was less noticeable compared to past visits.  MoCA was completed and patient scored 26 out of 30.  There was less concern for underlying psychosis today with patient presenting as less guarded and paranoid.  Anxiety symptoms were still present particularly related to medications.  Given this we we will start patient on Lexapro  5 mg reviewed risk and benefits.  Had also discussed beginning to taper off Ativan  given that catatonia symptoms seem to be improved.  Patient preferred to minimize medication changes today and we will plan to address this at next visit.  Psychotherapeutic interventions were used during today's session. From 4:08 PM to 4:48 PM. Therapeutic interventions included empathic listening, supportive therapy, cognitive and behavioral therapy, motivational interviewing. Used supportive interviewing techniques to provide emotional validation.  His cognitive reframing and motivational interviewing techniques to encourage medication compliance and navigate distorted perceptions around these. Improvement was evidenced by patient's participation and identified commitment to therapy goals.   Risk Assessment: An assessment of suicide and violence risk factors was performed as part of this evaluation and is not significantly changed from the last visit. While future psychiatric events cannot be accurately predicted, the patient does not currently require acute inpatient psychiatric care and does not currently meet [place]  involuntary commitment criteria. Patient was given contact information for crisis resources, behavioral health clinic and was instructed to call  911 for emergencies.  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  Patient was more engaged during session today and was the primary historian compared to previous visits where patient would defer to her mother frequently.  Terri Rich reports that things have been about the same in the past 2 months.  She believes her memory may have slightly improved but not significantly.  She reports that her day-to-day is largely unchanged spending most of her time at home watching TV or playing games.  She will occasionally go out with her mom and or go for a walk with her.  Patient reports that she did have some thoughts about returning to school in the fall however before returning she wants to figure out what kind of career path she will have.  She describes it being pointless to return without any direction.  Guarding the career path she has limited insight on how to determine this.  She is expressed some interest in business and says she needs to research but is uncertain what that research would look like.  Patient completed MoCA assessment today and scored a 26 out of  30 missing points on language, delayed recall, and visual-spatial/executive functioning.  Also noted that while she did get all serial subtraction to correct patient was significantly delayed taking over a minute to finish this.  Given her previous school performance and entry into colleges suspected that she would have been much more rapid to complete this in the past.  Discussed the ongoing memory concerns and the multi factorial relationship to ECT, coverage from autoimmune encephalitis, concern for underlying anxiety/depression contributing to symptoms, and potential adverse effects from the benzodiazepine.  Regarding catatonia symptoms seem to have improved significantly given patient's increased engagement during sessions.  We did discuss tapering the Ativan  today however patient declined.  She expressed significant hesitancy about starting Lexapro .  Worked on cognitive reframing techniques around this and patient ultimately was agreeable to starting the medication and giving it a 1 month trial at minimum.  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 versus developing psychotic disorder Psychiatric hospitalizations: 2 Hospitlaizations one [facility], was transferred to [facility] from [place] where patient received 15 rounds of ECT Past suicide attempts: Denies Hx of self harm: Denies though patient did have significant intake restriction to the point where she dropped down to 89 pounds Hx of violence towards others: Denies Prior psychiatric providers: Denies Prior therapy: Denies Access to firearms: Denies  Prior medication trials: Ativan  and Zyprexa   Substance use: Denies any substance use  Past Medical History:  Past Medical History:  Diagnosis Date   Anxiety    Catatonia  Schizophrenia [facility])     Past  Surgical History:  Procedure Laterality Date   EXCISION MASS, BACK N/A 02/02/2024   Procedure: EXCISION OF BACK MASS;  Surgeon: Polly Cordella LABOR, MD;  Location: [facility] OR;  Service: General;  Laterality: N/A;   OOPHORECTOMY Right     Family History: No family history on file.  Social History:  Social History   Socioeconomic History   Marital status: Single    Spouse name: Not on file   Number of children: Not on file   Years of education: Not on file   Highest education level: Not on file  Occupational History   Not on file  Tobacco Use   Smoking status: Never    Passive exposure: Never   Smokeless tobacco: Never  Substance and Sexual Activity   Alcohol use: No   Drug use: No   Sexual activity: Not on file  Other Topics Concern   Not on file  Social History Narrative   Not on file   Social Drivers of Health   Tobacco Use: Low Risk (05/10/2024)   Patient History    Smoking Tobacco Use: Never    Smokeless Tobacco Use: Never    Passive Exposure: Never  Financial Resource Strain: Not on File (12/12/2021)   Received from [employer]    Financial Resource Strain: 0  Food Insecurity: Low Risk (05/10/2023)   Received from [facility]   [facility] Food Security    Within the past 12 months, the food you bought just didn't last and you didn't have money to get more.: 3    Within the past 12 months, you worried that your food would run out before you got money to buy more.: 3  Transportation Needs: Not At Risk (05/10/2023)   Received from [facility]   [facility] Transportation Needs    In the past 12 months, has lack of reliable transportation kept you from medical appointments, meetings, work or from getting things needed for daily living?: No  Physical Activity: Unknown (05/10/2023)   Received from [facility]   Physical Activity    On average, how many days per week do you engage in moderate to strenuous exercise (like a brisk walk)?: 0 days    Physical  Activity - Minutes Per Session Most Recent Result: Not on file    Minutes of Exercise per Session: Not on file    Days of Exercise per Week: Not on file    Days of Exercise per Week PEA: Not on file    Minutes of Exercise per Session PEA: Not on file  Stress: Not on File (12/12/2021)   Received from [facility]   Stress    Stress: 0  Social Connections: Not on File (03/05/2023)   Received from [facility]   Social Connections    Connectedness: 0  Depression (PHQ2-9): Medium Risk (05/29/2024)   Depression (PHQ2-9)    PHQ-2 Score: 9  Alcohol Screen: Not on file  Housing: Unknown (10/22/2023)   Received from [facility]   Epic    Unable to Pay for Housing in the Last Year: Not on file    Number of Times Moved in the Last Year: Not on file    At any time in the past 12 months, were you homeless or living in a shelter (including now)?: No  Utilities: Not At Risk (05/10/2023)   Received from [facility]   [facility] Utilities    In the past 12 months has the electric, gas,  oil, or water company threatened to shut off services in your home?: No  Health Literacy: Not on file (05/10/2023)    Allergies:  Allergies  Allergen Reactions   Pollen Extract Other (See Comments)    Headaches    Current Medications: Current Outpatient Medications  Medication Sig Dispense Refill   escitalopram  (LEXAPRO ) 5 MG tablet Take 1 tablet (5 mg total) by mouth daily. 30 tablet 1   Cholecalciferol (VITAMIN D3) 10 MCG (400 UNIT) tablet Take 400 Units by mouth daily.     folic acid (FOLVITE) 400 MCG tablet Take 400 mcg by mouth daily. (Patient not taking: Reported on 05/29/2024)     LORazepam  (ATIVAN ) 1 MG tablet Take 1 tablet (1 mg total) by mouth 2 (two) times daily. 60 tablet 2   melatonin 3 MG TABS tablet Take 3 mg by mouth at bedtime as needed. (Patient not taking: Reported on 05/29/2024)     OLANZapine  (ZYPREXA ) 5 MG tablet Take 1 tablet (5 mg total) by mouth at bedtime. 30 tablet 2   No current  facility-administered medications for this visit.     Musculoskeletal: Strength & Muscle Tone: within normal limits Gait & Station: normal Patient leans: N/A  Psychiatric Specialty Exam: There were no vitals taken for this visit.There is no height or weight on file to calculate BMI. Review of Systems  General Appearance: Well Groomed  Eye Contact:  Fair  Speech:  Clear and Coherent and improving speech latency and paucity  Volume:  Decreased  Mood:  Euthymic  Affect:  Constricted  Thought Content: Mild paranoia   Suicidal Thoughts:  No  Homicidal Thoughts:  No  Thought Process:  Coherent  Orientation:  Full (Time, Place, and Person)    Memory: Immediate;   Fair  Judgment:  Impaired  Insight:  Lacking  Concentration:  Concentration: Fair  Recall:  not formally assessed   Fund of Knowledge: Fair  Language: Fair  Psychomotor Activity:  Decreased  Akathisia:  No  AIMS (if indicated): not done  Assets:  Housing  ADL's:  Intact  Cognition: WNL  Sleep:  Good   Metabolic Disorder Labs: No results found for: HGBA1C, MPG No results found for: PROLACTIN No results found for: CHOL, TRIG, HDL, CHOLHDL, VLDL, LDLCALC Lab Results  Component Value Date   TSH 1.082 04/10/2023    Therapeutic Level Labs: No results found for: LITHIUM No results found for: VALPROATE No results found for: CBMZ   Screenings: GAD-7    Flowsheet Row Office Visit from 05/29/2024 in [facility]  Total GAD-7 Score 5   PHQ2-9    Flowsheet Row [facility] from [facility]20/2023 in [facility]  PHQ-2 Total Score 2 6  PHQ-9 Total Score 9 21   Flowsheet Row Admission (Discharged) from 02/02/2024 in [facility] from 04/20/2023 in [facility] ED from [facility]10/20[facility] in [facility]  C-SSRS RISK CATEGORY No Risk No Risk No Risk    Collaboration of Care: Collaboration of Care: Medication Management AEB medication prescription and Other provider involved in patient's care AEB PCP and neurology chart review  Patient/Guardian was advised Release of Information must be obtained prior to any record release in order to collaborate their care with an outside provider. Patient/Guardian was advised if they have not already done so to contact the registration  department to sign all necessary forms in order for us  to release information regarding their care.   Consent: Patient/Guardian gives verbal consent for treatment and assignment of benefits for services provided during this visit. Patient/Guardian expressed understanding and agreed to proceed.    Arvella CHRISTELLA Finder, MD 07/11/2024, 4:54 PM

## 2024-07-11 ENCOUNTER — Ambulatory Visit (HOSPITAL_COMMUNITY): Payer: MEDICAID | Admitting: Psychiatry

## 2024-07-11 VITALS — BP 114/71 | HR 92 | Ht 63.0 in | Wt 142.0 lb

## 2024-07-11 DIAGNOSIS — F29 Unspecified psychosis not due to a substance or known physiological condition: Secondary | ICD-10-CM

## 2024-07-11 DIAGNOSIS — F202 Catatonic schizophrenia: Secondary | ICD-10-CM

## 2024-07-11 DIAGNOSIS — F411 Generalized anxiety disorder: Secondary | ICD-10-CM | POA: Diagnosis not present

## 2024-07-11 MED ORDER — ESCITALOPRAM OXALATE 5 MG PO TABS: 5.0000 mg | ORAL_TABLET | Freq: Every day | ORAL | 1 refills | Status: AC

## 2024-07-11 MED ORDER — OLANZAPINE 5 MG PO TABS: 5.0000 mg | ORAL_TABLET | Freq: Every day | ORAL | 2 refills | Status: AC

## 2024-07-12 ENCOUNTER — Encounter (HOSPITAL_COMMUNITY): Payer: Self-pay | Admitting: Psychiatry

## 2024-07-19 ENCOUNTER — Ambulatory Visit: Payer: MEDICAID | Admitting: Obstetrics and Gynecology

## 2024-08-03 ENCOUNTER — Ambulatory Visit: Payer: MEDICAID | Admitting: Obstetrics and Gynecology

## 2024-08-10 ENCOUNTER — Ambulatory Visit (HOSPITAL_COMMUNITY): Payer: MEDICAID | Admitting: Psychiatry
